# Patient Record
Sex: Female | Born: 1960 | Race: White | Hispanic: No | Marital: Married | Smoking: Current every day smoker
Health system: Southern US, Community
[De-identification: ages and names within clinical notes are randomized; demographics above are authoritative.]

## PROBLEM LIST (undated history)

## (undated) DIAGNOSIS — L409 Psoriasis, unspecified: Secondary | ICD-10-CM

## (undated) DIAGNOSIS — E559 Vitamin D deficiency, unspecified: Secondary | ICD-10-CM

## (undated) DIAGNOSIS — K219 Gastro-esophageal reflux disease without esophagitis: Secondary | ICD-10-CM

## (undated) DIAGNOSIS — J45909 Unspecified asthma, uncomplicated: Secondary | ICD-10-CM

## (undated) DIAGNOSIS — K900 Celiac disease: Secondary | ICD-10-CM

## (undated) DIAGNOSIS — Z972 Presence of dental prosthetic device (complete) (partial): Secondary | ICD-10-CM

## (undated) DIAGNOSIS — T7840XA Allergy, unspecified, initial encounter: Secondary | ICD-10-CM

## (undated) HISTORY — PX: HERNIA REPAIR: SHX51

## (undated) HISTORY — DX: Psoriasis, unspecified: L40.9

## (undated) HISTORY — DX: Allergy, unspecified, initial encounter: T78.40XA

## (undated) HISTORY — DX: Presence of dental prosthetic device (complete) (partial): Z97.2

## (undated) HISTORY — DX: Celiac disease: K90.0

## (undated) HISTORY — DX: Vitamin D deficiency, unspecified: E55.9

## (undated) HISTORY — DX: Gastro-esophageal reflux disease without esophagitis: K21.9

## (undated) HISTORY — DX: Unspecified asthma, uncomplicated: J45.909

---

## 1983-12-24 HISTORY — PX: KNEE ARTHROSCOPY: SUR90

## 1991-12-24 HISTORY — PX: TUBAL LIGATION: SHX77

## 2006-04-07 ENCOUNTER — Other Ambulatory Visit: Admission: RE | Admit: 2006-04-07 | Discharge: 2006-04-07 | Payer: Self-pay | Admitting: Gynecology

## 2006-04-14 ENCOUNTER — Encounter: Admission: RE | Admit: 2006-04-14 | Discharge: 2006-04-14 | Payer: Self-pay | Admitting: Gynecology

## 2006-05-15 ENCOUNTER — Encounter: Admission: RE | Admit: 2006-05-15 | Discharge: 2006-05-15 | Payer: Self-pay | Admitting: Gynecology

## 2006-08-28 ENCOUNTER — Encounter: Admission: RE | Admit: 2006-08-28 | Discharge: 2006-08-28 | Payer: Self-pay | Admitting: Gynecology

## 2007-05-25 ENCOUNTER — Encounter: Admission: RE | Admit: 2007-05-25 | Discharge: 2007-05-25 | Payer: Self-pay | Admitting: Family Medicine

## 2007-12-24 HISTORY — PX: UPPER GI ENDOSCOPY: SHX6162

## 2008-02-05 ENCOUNTER — Encounter: Payer: Self-pay | Admitting: Gastroenterology

## 2008-02-11 ENCOUNTER — Encounter: Admission: RE | Admit: 2008-02-11 | Discharge: 2008-02-11 | Payer: Self-pay | Admitting: Gastroenterology

## 2008-02-11 ENCOUNTER — Encounter (INDEPENDENT_AMBULATORY_CARE_PROVIDER_SITE_OTHER): Payer: Self-pay | Admitting: *Deleted

## 2008-12-23 HISTORY — PX: UMBILICAL HERNIA REPAIR: SHX196

## 2008-12-30 ENCOUNTER — Encounter: Admission: RE | Admit: 2008-12-30 | Discharge: 2008-12-30 | Payer: Self-pay | Admitting: Family Medicine

## 2009-01-10 ENCOUNTER — Encounter (INDEPENDENT_AMBULATORY_CARE_PROVIDER_SITE_OTHER): Payer: Self-pay | Admitting: *Deleted

## 2009-01-10 ENCOUNTER — Encounter: Admission: RE | Admit: 2009-01-10 | Discharge: 2009-01-10 | Payer: Self-pay | Admitting: Surgery

## 2009-12-25 ENCOUNTER — Encounter: Admission: RE | Admit: 2009-12-25 | Discharge: 2009-12-25 | Payer: Self-pay | Admitting: Family Medicine

## 2010-09-17 ENCOUNTER — Encounter (INDEPENDENT_AMBULATORY_CARE_PROVIDER_SITE_OTHER): Payer: Self-pay | Admitting: *Deleted

## 2010-10-30 ENCOUNTER — Ambulatory Visit: Payer: Self-pay | Admitting: Gastroenterology

## 2010-10-30 DIAGNOSIS — R109 Unspecified abdominal pain: Secondary | ICD-10-CM | POA: Insufficient documentation

## 2010-10-30 DIAGNOSIS — K900 Celiac disease: Secondary | ICD-10-CM | POA: Insufficient documentation

## 2010-11-26 ENCOUNTER — Encounter: Payer: Self-pay | Admitting: Gastroenterology

## 2010-11-27 ENCOUNTER — Encounter
Admission: RE | Admit: 2010-11-27 | Discharge: 2011-01-22 | Payer: Self-pay | Source: Home / Self Care | Attending: Gastroenterology | Admitting: Gastroenterology

## 2010-12-04 ENCOUNTER — Encounter: Payer: Self-pay | Admitting: Gastroenterology

## 2010-12-11 ENCOUNTER — Encounter
Admission: RE | Admit: 2010-12-11 | Discharge: 2010-12-11 | Payer: Self-pay | Source: Home / Self Care | Attending: Surgery | Admitting: Surgery

## 2010-12-23 HISTORY — PX: PELVIC LAPAROSCOPY: SHX162

## 2010-12-27 ENCOUNTER — Encounter: Payer: Self-pay | Admitting: Gastroenterology

## 2011-01-03 ENCOUNTER — Encounter: Payer: Self-pay | Admitting: Gastroenterology

## 2011-01-07 LAB — CBC
HCT: 38.3 % (ref 36.0–46.0)
Hemoglobin: 13 g/dL (ref 12.0–15.0)
MCH: 31.9 pg (ref 26.0–34.0)
MCHC: 33.9 g/dL (ref 30.0–36.0)
MCV: 93.9 fL (ref 78.0–100.0)
Platelets: 220 10*3/uL (ref 150–400)
RBC: 4.08 MIL/uL (ref 3.87–5.11)
RDW: 11.5 % (ref 11.5–15.5)
WBC: 6.2 10*3/uL (ref 4.0–10.5)

## 2011-01-07 LAB — SURGICAL PCR SCREEN
MRSA, PCR: NEGATIVE
Staphylococcus aureus: NEGATIVE

## 2011-01-08 ENCOUNTER — Ambulatory Visit (HOSPITAL_COMMUNITY)
Admission: RE | Admit: 2011-01-08 | Discharge: 2011-01-08 | Payer: Self-pay | Source: Home / Self Care | Attending: Surgery | Admitting: Surgery

## 2011-01-22 NOTE — Letter (Signed)
Summary: New Patient letter  [facility]  [address] [place], [place] [address]   Phone: [phone]  Fax: [phone]       09/17/2010 MRN: [id number]  Victoria Mann [address] [place], [place]  [address]  Dear Victoria Mann,  Welcome to the Gastroenterology Division at [facility].    You are scheduled to see Dr.  Ardis Hughs on 10-30-10 at 11:00a.m. on the 3rd floor at [facility], [place] [employer].  We ask that you try to arrive at our office 15 minutes prior to your appointment time to allow for check-in.  We would like you to complete the enclosed self-administered evaluation form prior to your visit and bring it with you on the day of your appointment.  We will review it with you.  Also, please bring a complete list of all your medications or, if you prefer, bring the medication bottles and we will list them.  Please bring your insurance card so that we may make a copy of it.  If your insurance requires a referral to see a specialist, please bring your referral form from your primary care physician.  Co-payments are due at the time of your visit and may be paid by cash, check or credit card.     Your office visit will consist of a consult with your physician (includes a physical exam), any laboratory testing he/she may order, scheduling of any necessary diagnostic testing (e.g. x-ray, ultrasound, CT-scan), and scheduling of a procedure (e.g. Endoscopy, Colonoscopy) if required.  Please allow enough time on your schedule to allow for any/all of these possibilities.    If you cannot keep your appointment, please call [phone] to cancel or reschedule prior to your appointment date.  This allows [place] the opportunity to schedule an appointment for another patient in need of care.  If you do not cancel or reschedule by 5 p.m. the business day prior to your appointment date, you will be charged a $50.00 late cancellation/no-show fee.    Thank you for choosing  [facility] for your medical needs.  We appreciate the opportunity to care for you.  Please visit [place] at our website  to learn more about our practice.                     Sincerely,                                                             The Gastroenterology Division

## 2011-01-22 NOTE — Procedures (Signed)
Summary: EGD/[facility]  EGD/[facility] By: Phillis Knack 11/14/2010 13:56:38  _____________________________________________________________________  External Attachment:    Type:   Image     Comment:   External Document

## 2011-01-22 NOTE — Assessment & Plan Note (Signed)
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 for ab out 2 years she has intermittent RUQ full feeling.  Acutally more chronic.   Had umbilical hernia repair about a year ago.  Dr. Brantley Stage. This was done for pain, constant burning near umbilicus.  NOw has a stabbing pain at umbilicus intermittently.  she saw Dr. Collene Mares, underwent endoscopy ?esophagitis found, no biopsies done 2009 or so (results not available). She was told to take nexium, caused severe hives.  She had blistering rash from prilosec.  she does get pyrosis about 2-3 times a week and takes [place].  Takes NSAIDs about 2-3 times a month.  She has gained about 10 pounds in the past year.  No overt GI bleeding.    lab tests done about 6 weeks ago show a very elevated TTG level at 77. Complete metabolic profile and CBC were otherwise normal.  she had an abdominal ultrasound as well as a HIDA scan about 2 years ago and these were normal.             Current Medications (verified): 1)  Advil 200 Mg Tabs (Ibuprofen) .... As Needed 2)  Ibuprofen 200 Mg Tabs (Ibuprofen) .... As Needed  Allergies (verified): 1)  ! Prilosec 2)  ! * Nexium 3)  ! 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  Review of Systems       Pertinent positive and negative review of systems were noted in the above HPI and GI specific review of systems.  All other review of systems was otherwise negative.   Vital Signs:  Patient profile:   50 year old female Height:      64 inches Weight:      134.13 pounds BMI:     23.11 Pulse rate:   80 / minute Pulse rhythm:   regular BP sitting:   122 / 60  (left arm) Cuff size:   regular  Vitals Entered By: June McMurray [place] Deborra Medina) (October 30, 2010 10:43 AM)  Physical Exam  Additional Exam:  Constitutional: generally well appearing Psychiatric: alert and oriented times 3 Eyes: extraocular movements intact Mouth: oropharynx moist, no lesions Neck: supple, no lymphadenopathy Cardiovascular: heart regular rate and rythm Lungs: CTA bilaterally Abdomen: soft, non-tender, non-distended, no obvious ascites, no peritoneal signs, normal bowel sounds Extremities: no lower extremity edema bilaterally Skin: no lesions on visible extremities    Impression & Recommendations:  Problem # 1:  right upper quadrant discomfort,. Umbilical stabbing pain she has had a normal HIDA scan, normal right upper quadrant ultrasound. Perhaps her nagging right upper quadrant discomforts are related to celiac sprue.  Perhaps it is acid phenomenon or even a lactose intolerance phenomenon. She has had esophagitis noted on EGD however she is very allergic to proton  pump inhibitors. The first recommended she try taking Pepcid twice daily for 3-4 weeks, then a lactose free trial, then a completely gluten-free trial to see if her dyspeptic symptoms are improved.  Be standing periumbilical pain may be related to her umbilical hernia, hernia repair 2010. I will send her back to her surgeon for further evaluation.  Patient Instructions: 1)  One of your biggest health concerns is your smoking.  You should try your  absolute best to stop.  If you need assistence, please contact your PCP or Smoking Cessation Class at [facility] [phone]) or [place] (1-800-QUIT-NOW). 2)  We will get records from Dr. Lorie Apley office from 2009 EGD. 3)  We will help with appt to see Dr. Brantley Stage again about umbilical hernia site, pains. 4)  Take one pepcid twice daily with food for 4 weeks.  If no improvement in symptoms, then stop dairy for 1 week.  If no improvement then gluten FREE completely for 2-3 months.  Call Dr. Ardis Hughs' to report on the way. 5)  A copy of this information will be sent to Colbert Ewing. 6)  The medication list was reviewed and reconciled.  All changed / newly prescribed medications were explained.  A complete medication list was provided to the patient / caregiver.  Appended Document: Orders Update/[facility] NUTRITION    Clinical Lists Changes  Problems: Added new problem of ABDOMINAL PAIN OTHER SPECIFIED SITE (ICD-789.09) Orders: Added new Test order of [facility]) - Signed Added new Test order of [facility] ([facility]) - Signed      Appended Document:  received records: EGD 01/2008 Dr. Collene Mares: normal EGD, no biopsies taken

## 2011-01-24 NOTE — Miscellaneous (Signed)
Summary: [facility] [place] [facility] [place] By: Rise Patience 12/05/2010 16:37:13  _____________________________________________________________________  External Attachment:    Type:   Image     Comment:   External Document

## 2011-01-24 NOTE — Letter (Signed)
Summary: [facility]   Imported By: Bubba Hales 12/18/2010 11:19:30  _____________________________________________________________________  External Attachment:    Type:   Image     Comment:   External Document

## 2011-01-28 ENCOUNTER — Encounter: Payer: Self-pay | Admitting: Gastroenterology

## 2011-01-29 ENCOUNTER — Ambulatory Visit: Payer: Self-pay | Admitting: *Deleted

## 2011-01-29 ENCOUNTER — Encounter: Admit: 2011-01-29 | Payer: Self-pay | Admitting: Family Medicine

## 2011-01-30 NOTE — Letter (Signed)
Summary: [facility]   Imported By: Phillis Knack 01/23/2011 10:44:05  _____________________________________________________________________  External Attachment:    Type:   Image     Comment:   External Document

## 2011-01-30 NOTE — Letter (Signed)
Summary: [facility]   Imported By: Phillis Knack 01/23/2011 10:42:38  _____________________________________________________________________  External Attachment:    Type:   Image     Comment:   External Document

## 2011-02-19 NOTE — Letter (Signed)
Summary: [facility]   Imported By: Rise Patience 02/12/2011 14:09:13  _____________________________________________________________________  External Attachment:    Type:   Image     Comment:   External Document

## 2011-11-18 DIAGNOSIS — K900 Celiac disease: Secondary | ICD-10-CM | POA: Insufficient documentation

## 2011-11-18 DIAGNOSIS — L409 Psoriasis, unspecified: Secondary | ICD-10-CM | POA: Insufficient documentation

## 2011-11-20 ENCOUNTER — Ambulatory Visit (INDEPENDENT_AMBULATORY_CARE_PROVIDER_SITE_OTHER): Payer: PRIVATE HEALTH INSURANCE | Admitting: Women's Health

## 2011-11-20 ENCOUNTER — Other Ambulatory Visit (HOSPITAL_COMMUNITY)
Admission: RE | Admit: 2011-11-20 | Discharge: 2011-11-20 | Disposition: A | Discharge status: Home / Self Care | Payer: PRIVATE HEALTH INSURANCE | Source: Ambulatory Visit | Attending: Obstetrics and Gynecology | Admitting: Obstetrics and Gynecology

## 2011-11-20 ENCOUNTER — Encounter: Payer: Self-pay | Admitting: Women's Health

## 2011-11-20 VITALS — BP 110/70 | Ht 64.0 in | Wt 135.0 lb

## 2011-11-20 DIAGNOSIS — Z01419 Encounter for gynecological examination (general) (routine) without abnormal findings: Secondary | ICD-10-CM

## 2011-11-20 DIAGNOSIS — N938 Other specified abnormal uterine and vaginal bleeding: Secondary | ICD-10-CM

## 2011-11-20 DIAGNOSIS — Z13220 Encounter for screening for lipoid disorders: Secondary | ICD-10-CM

## 2011-11-20 DIAGNOSIS — N949 Unspecified condition associated with female genital organs and menstrual cycle: Secondary | ICD-10-CM

## 2011-11-20 NOTE — Progress Notes (Signed)
Victoria Mann 03-26-1961 [id number]    History:    The patient presents for annual exam.  Works for [employer]. Has an 49 year old daughter Victoria Mann who she adopted from a nephew at 48 months.   Past medical history, past surgical history, family history and social history were all reviewed and documented in the EPIC chart.   ROS:  A  ROS was performed and pertinent positives and negatives are included in the history.  Exam:  Filed Vitals:   11/20/11 1025  BP: 110/70    General appearance:  Normal Head/Neck:  Normal, without cervical or supraclavicular adenopathy. Thyroid:  Symmetrical, normal in size, without palpable masses or nodularity. Respiratory  Effort:  Normal  Auscultation:  Clear without wheezing or rhonchi Cardiovascular  Auscultation:  Regular rate, without rubs, murmurs or gallops  Edema/varicosities:  Not grossly evident Abdominal  Soft,nontender, without masses, guarding or rebound.  Liver/spleen:  No organomegaly noted  Hernia:  None appreciated  Skin  Inspection:  Grossly normal  Palpation:  Grossly normal Neurologic/psychiatric  Orientation:  Normal with appropriate conversation.  Mood/affect:  Normal  Genitourinary    Breasts: Examined lying and sitting. Pendulous     Right: Without masses, retractions, discharge or axillary adenopathy.     Left: Without masses, retractions, discharge or axillary adenopathy.   Inguinal/mons:  Normal without inguinal adenopathy  External genitalia:  Normal  BUS/Urethra/Skene's glands:  Normal  Bladder:  Normal  Vagina:  Normal  Cervix:  Normal/numerous nabothian cysts  Uterus:   normal in size, shape and contour.  Midline and mobile  Adnexa/parametria:     Rt: Without masses or tenderness.   Lt: Without masses or tenderness.  Anus and perineum: Normal  Digital rectal exam: Normal sphincter tone without palpated masses or tenderness  Assessment/Plan:  50 y.o. MWF G3 P2 +1 adopted for annual exam.  Monthly  10 day cycle for the last year last cycle 3 weeks. History of  BTL. States has always had long cycles but they have gotten progressively longer. Has had minimal healthcare in the last few years. Normal mammogram in 2007, 2011. Normal pap 2007. Had a normal colonoscopy 10 years ago.   DUB Celiac disease  Plan: Sonohysterogram with Dr. Toney Rakes. CBC, TSH, prolactin,  lipid profile, UA and Pap. SBEs, annual mammogram encouraged, reviewed importance of screening. Continue exercise, calcium rich diet encouraged, vitamin D 1000 daily.  Huel Cote [facility], 12:24 PM 11/20/2011

## 2011-11-25 ENCOUNTER — Ambulatory Visit (INDEPENDENT_AMBULATORY_CARE_PROVIDER_SITE_OTHER): Payer: PRIVATE HEALTH INSURANCE

## 2011-11-25 ENCOUNTER — Other Ambulatory Visit: Payer: Self-pay | Admitting: Gynecology

## 2011-11-25 ENCOUNTER — Ambulatory Visit (INDEPENDENT_AMBULATORY_CARE_PROVIDER_SITE_OTHER): Payer: PRIVATE HEALTH INSURANCE | Admitting: Gynecology

## 2011-11-25 DIAGNOSIS — N938 Other specified abnormal uterine and vaginal bleeding: Secondary | ICD-10-CM

## 2011-11-25 DIAGNOSIS — N946 Dysmenorrhea, unspecified: Secondary | ICD-10-CM

## 2011-11-25 DIAGNOSIS — N920 Excessive and frequent menstruation with regular cycle: Secondary | ICD-10-CM

## 2011-11-25 DIAGNOSIS — N831 Corpus luteum cyst of ovary, unspecified side: Secondary | ICD-10-CM

## 2011-11-25 DIAGNOSIS — N949 Unspecified condition associated with female genital organs and menstrual cycle: Secondary | ICD-10-CM

## 2011-11-25 DIAGNOSIS — N852 Hypertrophy of uterus: Secondary | ICD-10-CM

## 2011-11-25 DIAGNOSIS — D259 Leiomyoma of uterus, unspecified: Secondary | ICD-10-CM

## 2011-11-25 DIAGNOSIS — D251 Intramural leiomyoma of uterus: Secondary | ICD-10-CM

## 2011-11-25 NOTE — Progress Notes (Signed)
50 year old gravida 3 para 2 who was seen in the office November 28 and had brought to our attention that over the course of the past year her cycles been lasting 10 days very heavy the first few days of blood clots and cramping. She denies any intermenstrual bleeding. Patient history of tubal ligation the past. She presented today for sonohysterogram and endometrial biopsy and discussion of treatment options. Patient is a smoker.  Ultrasound: Uterus measured 8.2 x 5.0 x 3.8 cm with an endometrial stripe of 2.7 mm a small intramural myoma measuring 14 x 14 x 11 mm was no noted. Right ovary was normal left ovary had a thick wall cyst internal low level echoes in the periphery measuring 13 x 11 mm. (Corpus luteum cyst). Sonohysterogram no intracavitary defects were noted.  Assessment: Dysmenorrhea menorrhagia. Patient will be a good candidate for outpatient endometrial ablation in our office via the her option technique. We did proceed with doing an endometrial biopsy today in a sterile fashion tissue submitted for histological evaluation. Literature information on endometrial ablation was provided. We'll make arrangements to schedule in the next 2 weeks after the biopsy report comes back. We will place her on Prometrium 200 mg for 2 weeks and plan the ablation at that time. We'll see her the day before for preoperative for placement of laminaria intracervically. All questions were answered and a follow accordingly.

## 2011-11-26 ENCOUNTER — Telehealth: Payer: Self-pay

## 2011-11-26 NOTE — Telephone Encounter (Signed)
Dr. Moshe Salisbury sent me the following staff message: I saw  this patient today in the sonohysterogram and endometrial biopsy. She's had a previous tubal ligation procedure. And has suffered for menorrhagia. I gave her literature formation on the her option endometrial ablation. If you can ASAP check her insurance coverage and then calling her Prometrium 200 mg to take 1 by mouth daily for the next 12 days and we could possibly schedule her for her option on the week of December 17. I would need to see her the day before for placement of laminaria.  I checked her insurance benefits and called her.  Ablation in office applies as Outpatient surgery and she has a deductible and coinsurance instead of a copayment.  It will make her cost $952.00.  Patient said there is no way she will ever be able to afford that.  I told her I would let Dr. Moshe Salisbury know.

## 2011-11-28 ENCOUNTER — Telehealth: Payer: Self-pay

## 2011-11-28 NOTE — Telephone Encounter (Signed)
I called patient to let her know that I spoke with my administrator and we would like to work with her to help her be able to have Her Option Ablation.  We could waive the prepayment and just bill it to her and she could make monthly payments to [facility] for the balance.  She thanked me but said she [place] must wait as she is paying on $1000 bill she [place] has with [facility] from some surgery last year.  I asked her did Dr. Moshe Salisbury give her other options to help her and she said that he did.  She does not want to take any meds to help i.e. birth control pills.  She said she wanted to watch it for the next few months in hopes things would straighten out. I told her to call me if I can help in any way.

## 2013-11-29 ENCOUNTER — Ambulatory Visit (INDEPENDENT_AMBULATORY_CARE_PROVIDER_SITE_OTHER): Payer: 59 | Admitting: Family Medicine

## 2013-11-29 ENCOUNTER — Ambulatory Visit: Payer: 59

## 2013-11-29 VITALS — BP 124/74 | HR 68 | Temp 98.3°F | Resp 16 | Ht 63.25 in | Wt 143.6 lb

## 2013-11-29 DIAGNOSIS — S20212A Contusion of left front wall of thorax, initial encounter: Secondary | ICD-10-CM

## 2013-11-29 DIAGNOSIS — R071 Chest pain on breathing: Secondary | ICD-10-CM

## 2013-11-29 DIAGNOSIS — R0789 Other chest pain: Secondary | ICD-10-CM

## 2013-11-29 DIAGNOSIS — S20219A Contusion of unspecified front wall of thorax, initial encounter: Secondary | ICD-10-CM

## 2013-11-29 MED ORDER — HYDROCODONE-ACETAMINOPHEN 5-325 MG PO TABS: 1.0000 | ORAL_TABLET | Freq: Three times a day (TID) | ORAL | Status: DC | PRN

## 2013-11-29 NOTE — Progress Notes (Signed)
[facility] [address], [place] [address] [phone]  Date:  11/29/2013   Name:  Victoria Mann   DOB:  March 14, 1961   MRN:  [id number]  PCP:  Jenny Reichmann, MD    Chief Complaint: Fall   History of Present Illness:  Victoria Mann is a 52 y.o. very pleasant female patient who presents with the following:  She is here today with an injury that occurred 2 days ago. She was pushing some boxes of chirstmas decor down the hall and the box shot out from under her.  She fell forward onto the floor.  "I really went down hard."   She has been ill with a cold for about one week as well.  She is concerned because she had a cough with this illness and was bringing some material up; now she cannot cough due to pain.   She notes pain when she coughs, sneezes, moves, breathes deeply.  Her left breast and her ribs hurt.   However she does not have any CP at rest.  When sitting Abair she is comfortable No head injury or LOC.  No abdominal pain, she is able to eat ok.  No nausea or vomiting  She has not noted a fever.   She has some tingling in her left hand when she abducts her arm.   Her neck/ trapezius and upper shoulder have been a bit sore   She is generally healthy except for celiac.   LMP was last month. She is peri- menopausal and her menses are spacing out.   S/p BTL.   Patient Active Problem List   Diagnosis Date Noted  . Menorrhagia 11/25/2011  . Psoriasis   . Celiac sprue   . 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 ABDOMINAL PAIN OTHER SPECIFIED SITE 10/30/2010    Past Medical History  Diagnosis Date  . Psoriasis   . Celiac sprue     Past Surgical History  Procedure Laterality Date  . Tubal ligation  1993  . Knee arthroscopy  1985    LEFT  . Umbilical hernia repair  Jan 2010  . Pelvic laparoscopy  Jan 2012    To remove scar tissue    History  Substance Use Topics  . Smoking status: Current Every Day Smoker -- 0.50 packs/day for 35 years    Types:  Cigarettes  . Smokeless tobacco: Never Used  . Alcohol Use: Yes     Comment: occ.    Family History  Problem Relation Age of Onset  . Diabetes Mother   . Hypertension Mother   . Hyperlipidemia Mother   . Cancer Father     LUNG  . Heart disease Maternal Grandmother     Allergies  Allergen Reactions  . Esomeprazole Magnesium   . Lansoprazole   . Omeprazole     REACTION: rash    Medication list has been reviewed and updated.  Current Outpatient Prescriptions on File Prior to Visit  Medication Sig Dispense Refill  . Multiple Vitamin (MULTIVITAMIN) capsule Take 1 capsule by mouth daily.         No current facility-administered medications on file prior to visit.    Review of Systems:  As per HPI- otherwise negative.   Physical Examination: Filed Vitals:   11/29/13 1225  BP: 124/74  Pulse: 68  Temp: 98.3 F (36.8 C)  Resp: 16   Filed Vitals:   11/29/13 1225  Height: 5' 3.25" (1.607 m)  Weight: 143  lb 9.6 oz (65.137 kg)   Body mass index is 25.22 kg/(m^2). Ideal Body Weight: Weight in (lb) to have BMI = 25: 142  GEN: WDWN, NAD, Non-toxic, A & O x 3, looks well but is upset about her fall, tearful with palpation of the painful areas.   HEENT: Atraumatic, Normocephalic. Neck supple. No masses, No LAD.  Bilateral TM wnl, oropharynx normal.  PEERL,EOMI.   Ears and Nose: No external deformity. CV: RRR, No M/G/R. No JVD. No thrill. No extra heart sounds. PULM: CTA B, no wheezes, crackles, rhonchi. No retractions. No resp. distress. No accessory muscle use. ABD: S, NT, ND. No rebound. No HSM. No bruise over abdomen EXTR: No c/c/e NEURO Normal gait.  PSYCH: Normally interactive. Conversant. Not depressed or anxious appearing.  Calm demeanor.  Left shoulder with full ROM.  She is slightly tender over the anterior shoulder.  No bruise.  She has normal strength and sensation of the LUE.  Chest wall: she is quite tender over her left anterior ribs, especially under the  left breast. No bruise.  No wound or lesion  [facility] reading (PRIMARY) by  Dr. Lorelei Pont. Left ribs: negative Chest: negative  CHEST - 1 VIEW  COMPARISON: Chest radiograph and rib study 11/29/2013  FINDINGS: Single lateral view of the chest was obtained. Lungs are clear on the lateral view. Alignment of the thoracic spine is grossly normal.  IMPRESSION: No acute findings.  LEFT RIBS AND CHEST - 3+ VIEW  COMPARISON: Chest 11/29/2013  FINDINGS: Chest radiograph is negative for a pneumothorax. There are subtle densities at the left lung base could represent overlying structures based on the lateral view and no focal lesion on the oblique images in this area. . Otherwise, the lungs are clear. Heart and mediastinum are within normal limits. The trachea is midline. No evidence for a displaced left rib fracture.  IMPRESSION: No evidence for a displaced left rib fracture.  Vague density at the left lung base is probably related to cardiophrenic fat based on the lateral chest view and oblique views. Consider comparison with an old study or 3 month followup to ensure stability.  Assessment and Plan: Rib contusion, left, initial encounter - Plan: DG Ribs Unilateral W/Chest Left, DG Chest 1 View, HYDROcodone-acetaminophen (NORCO/VICODIN) 5-325 MG per tablet  Rib contusion and pain.  At this time I do not see any evidence of a displaced fracture.  vicodin for pain.  Reassured that her lungs are clear without evidence of pneumonia. Encouraged good pulmonary toilet to prevent pneumonia    Signed Lamar Blinks, MD

## 2013-11-29 NOTE — Patient Instructions (Signed)
Use the pain medication as needed for your rib pain.  Try to take some deep breaths a few times a day to keep your lungs open.  If you develop any fever please let me know

## 2014-10-07 ENCOUNTER — Other Ambulatory Visit: Payer: Self-pay

## 2014-10-25 ENCOUNTER — Encounter: Payer: Self-pay | Admitting: Women's Health

## 2014-10-25 ENCOUNTER — Other Ambulatory Visit: Payer: Self-pay

## 2014-10-25 ENCOUNTER — Ambulatory Visit (INDEPENDENT_AMBULATORY_CARE_PROVIDER_SITE_OTHER): Payer: BC Managed Care – PPO | Admitting: Women's Health

## 2014-10-25 ENCOUNTER — Other Ambulatory Visit (HOSPITAL_COMMUNITY)
Admission: RE | Admit: 2014-10-25 | Discharge: 2014-10-25 | Disposition: A | Discharge status: Home / Self Care | Payer: BC Managed Care – PPO | Source: Ambulatory Visit | Attending: Gynecology | Admitting: Gynecology

## 2014-10-25 VITALS — BP 142/78 | Ht 64.0 in | Wt 145.0 lb

## 2014-10-25 DIAGNOSIS — Z01419 Encounter for gynecological examination (general) (routine) without abnormal findings: Secondary | ICD-10-CM

## 2014-10-25 DIAGNOSIS — Z780 Asymptomatic menopausal state: Secondary | ICD-10-CM

## 2014-10-25 DIAGNOSIS — Z13220 Encounter for screening for lipoid disorders: Secondary | ICD-10-CM

## 2014-10-25 DIAGNOSIS — Z1231 Encounter for screening mammogram for malignant neoplasm of breast: Secondary | ICD-10-CM

## 2014-10-25 DIAGNOSIS — N760 Acute vaginitis: Secondary | ICD-10-CM

## 2014-10-25 LAB — CBC WITH DIFFERENTIAL/PLATELET
Basophils Absolute: 0 10*3/uL (ref 0.0–0.1)
Basophils Relative: 0 % (ref 0–1)
Eosinophils Absolute: 0.1 10*3/uL (ref 0.0–0.7)
Eosinophils Relative: 1 % (ref 0–5)
HCT: 41.7 % (ref 36.0–46.0)
Hemoglobin: 14.3 g/dL (ref 12.0–15.0)
Lymphocytes Relative: 32 % (ref 12–46)
Lymphs Abs: 2.4 10*3/uL (ref 0.7–4.0)
MCH: 32.3 pg (ref 26.0–34.0)
MCHC: 34.3 g/dL (ref 30.0–36.0)
MCV: 94.1 fL (ref 78.0–100.0)
Monocytes Absolute: 0.4 10*3/uL (ref 0.1–1.0)
Monocytes Relative: 5 % (ref 3–12)
Neutro Abs: 4.6 10*3/uL (ref 1.7–7.7)
Neutrophils Relative %: 62 % (ref 43–77)
Platelets: 297 10*3/uL (ref 150–400)
RBC: 4.43 MIL/uL (ref 3.87–5.11)
RDW: 13.1 % (ref 11.5–15.5)
WBC: 7.4 10*3/uL (ref 4.0–10.5)

## 2014-10-25 LAB — COMPREHENSIVE METABOLIC PANEL
ALK PHOS: 80 U/L (ref 39–117)
ALT: 13 U/L (ref 0–35)
AST: 18 U/L (ref 0–37)
Albumin: 4.2 g/dL (ref 3.5–5.2)
BILIRUBIN TOTAL: 0.4 mg/dL (ref 0.2–1.2)
BUN: 9 mg/dL (ref 6–23)
CO2: 27 meq/L (ref 19–32)
CREATININE: 0.6 mg/dL (ref 0.50–1.10)
Calcium: 9.1 mg/dL (ref 8.4–10.5)
Chloride: 104 mEq/L (ref 96–112)
GLUCOSE: 82 mg/dL (ref 70–99)
Potassium: 3.8 mEq/L (ref 3.5–5.3)
Sodium: 141 mEq/L (ref 135–145)
Total Protein: 6.3 g/dL (ref 6.0–8.3)

## 2014-10-25 LAB — LIPID PANEL
CHOL/HDL RATIO: 4.3 ratio
CHOLESTEROL: 229 mg/dL — AB (ref 0–200)
HDL: 53 mg/dL (ref >39)
LDL CALC: 133 mg/dL — AB (ref 0–99)
TRIGLYCERIDES: 217 mg/dL — AB (ref <150)
VLDL: 43 mg/dL — AB (ref 0–40)

## 2014-10-25 NOTE — Progress Notes (Signed)
Victoria Mann 14-Jun-1961 [id number]    History:    Presents for annual exam.  BTL. No monthly cycle for one year.  Occasional hot flushes.  Hx of cryotherapy at 53 years old, with normal paps after. 2012 Pap normal. 2011 mammogram with F/U US for painful cysts. Benign. History of Celiac Dz since birth. Well controlled with gluten free diet.  Colonoscopy <10 years ago, no polyps. Smoker, not ready to quit.  Past medical history, past surgical history, family history and social history were all reviewed and documented in the EPIC chart. Works for [employer]. 2 children in 22s doing well, one adopted child 10.  Mother diabetes, HLD, HTN. Father died of lung cancer.   ROS:  A  12 point ROS was performed and pertinent positives and negatives are included.  Exam:  Filed Vitals:   10/25/14 1126  BP: 142/78    General appearance:  Normal Thyroid:  Symmetrical, normal in size, without palpable masses or nodularity. Respiratory  Auscultation:  Clear without wheezing or rhonchi Cardiovascular  Auscultation:  Regular rate, without rubs, murmurs or gallops  Edema/varicosities:  Not grossly evident Abdominal  Soft,nontender, without masses, guarding or rebound.  Liver/spleen:  No organomegaly noted  Hernia:  None appreciated  Skin  Inspection:  Grossly normal   Breasts: Examined lying and sitting.     Right: Without masses, retractions, discharge or axillary adenopathy.     Left: Without masses, retractions, discharge or axillary adenopathy. Gentitourinary   Inguinal/mons:  Normal without inguinal adenopathy  External genitalia:  Normal  BUS/Urethra/Skene's glands:  Normal  Vagina:  Normal  Cervix:  Normal  Uterus:  normal in size, shape and contour.  Midline and mobile  Adnexa/parametria:     Rt: Without masses or tenderness.   Lt: Without masses or tenderness.  Anus and perineum: Normal  Digital rectal exam: Normal sphincter tone without palpated masses or  tenderness  Assessment/Plan:  53 y.o. MWF G3P2 + 1 adopted child  for annual exam with no complaints.     Postmenopausal, No HRT/No bleeding Celiac disease smoker  Plan: CBC, CMP, lipid profile, Vit. D level, UA and Pap with HR HPV typing, new screening guidelines reviewed. SBEs, annual mammogram encouraged, overdue, reviewed importance of annual screen. Continue exercise, calcium rich diet encouraged. DEXA scan, instructed to schedule here. Reviewed importance of no smoking, not ready to quit.     Huel Cote [facility], 12:25 PM 10/25/2014

## 2014-10-25 NOTE — Patient Instructions (Signed)
Health Recommendations for Postmenopausal Women Respected and ongoing research has looked at the most common causes of death, disability, and poor quality of life in postmenopausal women. The causes include heart disease, diseases of blood vessels, diabetes, depression, cancer, and bone loss (osteoporosis). Many things can be done to help lower the chances of developing these and other common problems. CARDIOVASCULAR DISEASE Heart Disease: A heart attack is a medical emergency. Know the signs and symptoms of a heart attack. Below are things women can do to reduce their risk for heart disease.   Do not smoke. If you smoke, quit.  Aim for a healthy weight. Being overweight causes many preventable deaths. Eat a healthy and balanced diet and drink an adequate amount of liquids.  Get moving. Make a commitment to be more physically active. Aim for 30 minutes of activity on most, if not all days of the week.  Eat for heart health. Choose a diet that is low in saturated fat and cholesterol and eliminate trans fat. Include whole grains, vegetables, and fruits. Read and understand the labels on food containers before buying.  Know your numbers. Ask your caregiver to check your blood pressure, cholesterol (total, HDL, LDL, triglycerides) and blood glucose. Work with your caregiver on improving your entire clinical picture.  High blood pressure. Limit or stop your table salt intake (try salt substitute and food seasonings). Avoid salty foods and drinks. Read labels on food containers before buying. Eating well and exercising can help control high blood pressure. STROKE  Stroke is a medical emergency. Stroke may be the result of a blood clot in a blood vessel in the brain or by a brain hemorrhage (bleeding). Know the signs and symptoms of a stroke. To lower the risk of developing a stroke:  Avoid fatty foods.  Quit smoking.  Control your diabetes, blood pressure, and irregular heart rate. THROMBOPHLEBITIS  (BLOOD CLOT) OF THE LEG  Becoming overweight and leading a stationary lifestyle may also contribute to developing blood clots. Controlling your diet and exercising will help lower the risk of developing blood clots. CANCER SCREENING  Breast Cancer: Take steps to reduce your risk of breast cancer.  You should practice "breast self-awareness." This means understanding the normal appearance and feel of your breasts and should include breast self-examination. Any changes detected, no matter how small, should be reported to your caregiver.  After age 43, you should have a clinical breast exam (CBE) every year.  Starting at age 61, you should consider having a mammogram (breast X-ray) every year.  If you have a family history of breast cancer, talk to your caregiver about genetic screening.  If you are at high risk for breast cancer, talk to your caregiver about having an MRI and a mammogram every year.  Intestinal or Stomach Cancer: Tests to consider are a rectal exam, fecal occult blood, sigmoidoscopy, and colonoscopy. Women who are high risk may need to be screened at an earlier age and more often.  Cervical Cancer:  Beginning at age 60, you should have a Pap test every 3 years as long as the past 3 Pap tests have been normal.  If you have had past treatment for cervical cancer or a condition that could lead to cancer, you need Pap tests and screening for cancer for at least 20 years after your treatment.  If you had a hysterectomy for a problem that was not cancer or a condition that could lead to cancer, then you no longer need Pap tests.  If you are between ages 41 and 93, and you have had normal Pap tests going back 10 years, you no longer need Pap tests.  If Pap tests have been discontinued, risk factors (such as a new sexual partner) need to be reassessed to determine if screening should be resumed.  Some medical problems can increase the chance of getting cervical cancer. In these  cases, your caregiver may recommend more frequent screening and Pap tests.  Uterine Cancer: If you have vaginal bleeding after reaching menopause, you should notify your caregiver.  Ovarian Cancer: Other than yearly pelvic exams, there are no reliable tests available to screen for ovarian cancer at this time except for yearly pelvic exams.  Lung Cancer: Yearly chest X-rays can detect lung cancer and should be done on high risk women, such as cigarette smokers and women with chronic lung disease (emphysema).  Skin Cancer: A complete body skin exam should be done at your yearly examination. Avoid overexposure to the sun and ultraviolet light lamps. Use a strong sun block cream when in the sun. All of these things are important for lowering the risk of skin cancer. MENOPAUSE Menopause Symptoms: Hormone therapy products are effective for treating symptoms associated with menopause:  Moderate to severe hot flashes.  Night sweats.  Mood swings.  Headaches.  Tiredness.  Loss of sex drive.  Insomnia.  Other symptoms. Hormone replacement carries certain risks, especially in older women. Women who use or are thinking about using estrogen or estrogen with progestin treatments should discuss that with their caregiver. Your caregiver will help you understand the benefits and risks. The ideal dose of hormone replacement therapy is not known. The Food and Drug Administration (FDA) has concluded that hormone therapy should be used only at the lowest doses and for the shortest amount of time to reach treatment goals.  OSTEOPOROSIS Protecting Against Bone Loss and Preventing Fracture If you use hormone therapy for prevention of bone loss (osteoporosis), the risks for bone loss must outweigh the risk of the therapy. Ask your caregiver about other medications known to be safe and effective for preventing bone loss and fractures. To guard against bone loss or fractures, the following is recommended:  If  you are younger than age 48, take 1000 mg of calcium and at least 600 mg of Vitamin D per day.  If you are older than age 28 but younger than age 52, take 1200 mg of calcium and at least 600 mg of Vitamin D per day.  If you are older than age 47, take 1200 mg of calcium and at least 800 mg of Vitamin D per day. Smoking and excessive alcohol intake increases the risk of osteoporosis. Eat foods rich in calcium and vitamin D and do weight bearing exercises several times a week as your caregiver suggests. DIABETES Diabetes Mellitus: If you have type I or type 2 diabetes, you should keep your blood sugar under control with diet, exercise, and recommended medication. Avoid starchy and fatty foods, and too many sweets. Being overweight can make diabetes control more difficult. COGNITION AND MEMORY Cognition and Memory: Menopausal hormone therapy is not recommended for the prevention of cognitive disorders such as Alzheimer's disease or memory loss.  DEPRESSION  Depression may occur at any age, but it is common in elderly women. This may be because of physical, medical, social (loneliness), or financial problems and needs. If you are experiencing depression because of medical problems and control of symptoms, talk to your caregiver about this. Physical  activity and exercise may help with mood and sleep. Community and volunteer involvement may improve your sense of value and worth. If you have depression and you feel that the problem is getting worse or becoming severe, talk to your caregiver about which treatment options are best for you. ACCIDENTS  Accidents are common and can be serious in elderly woman. Prepare your house to prevent accidents. Eliminate throw rugs, place hand bars in bath, shower, and toilet areas. Avoid wearing high heeled shoes or walking on wet, snowy, and icy areas. Limit or stop driving if you have vision or hearing problems, or if you feel you are unsteady with your movements and  reflexes. HEPATITIS C Hepatitis C is a type of viral infection affecting the liver. It is spread mainly through contact with blood from an infected person. It can be treated, but if left untreated, it can lead to severe liver damage over the years. Many people who are infected do not know that the virus is in their blood. If you are a "baby-boomer", it is recommended that you have one screening test for Hepatitis C. IMMUNIZATIONS  Several immunizations are important to consider having during your senior years, including:   Tetanus, diphtheria, and pertussis booster shot.  Influenza every year before the flu season begins.  Pneumonia vaccine.  Shingles vaccine.  Others, as indicated based on your specific needs. Talk to your caregiver about these. Document Released: 01/31/2006 Document Revised: 04/25/2014 Document Reviewed: 09/26/2008 [facility] Patient Information 2015 [place], [place]. This information is not intended to replace advice given to you by your health care provider. Make sure you discuss any questions you have with your health care provider.

## 2014-10-25 NOTE — Addendum Note (Signed)
Addended by: Burnett Kanaris on: 10/25/2014 03:48 PM   Modules accepted: Orders, SmartSet

## 2014-10-26 ENCOUNTER — Other Ambulatory Visit: Payer: Self-pay | Admitting: *Deleted

## 2014-10-26 DIAGNOSIS — E559 Vitamin D deficiency, unspecified: Secondary | ICD-10-CM

## 2014-10-26 DIAGNOSIS — E782 Mixed hyperlipidemia: Secondary | ICD-10-CM

## 2014-10-26 LAB — VITAMIN D 25 HYDROXY (VIT D DEFICIENCY, FRACTURES): Vit D, 25-Hydroxy: 21 ng/mL — ABNORMAL LOW (ref 30–89)

## 2014-10-26 MED ORDER — VITAMIN D (ERGOCALCIFEROL) 1.25 MG (50000 UNIT) PO CAPS: 50000.0000 [IU] | ORAL_CAPSULE | ORAL | Status: DC

## 2014-10-27 LAB — CYTOLOGY - PAP

## 2014-11-09 ENCOUNTER — Ambulatory Visit
Admission: RE | Admit: 2014-11-09 | Discharge: 2014-11-09 | Disposition: A | Discharge status: Home / Self Care | Payer: BC Managed Care – PPO | Source: Ambulatory Visit

## 2014-11-09 DIAGNOSIS — Z1231 Encounter for screening mammogram for malignant neoplasm of breast: Secondary | ICD-10-CM

## 2014-11-10 ENCOUNTER — Encounter: Payer: Self-pay | Admitting: Women's Health

## 2015-01-02 ENCOUNTER — Encounter: Payer: Self-pay | Admitting: Women's Health

## 2015-01-17 ENCOUNTER — Other Ambulatory Visit: Payer: BLUE CROSS/BLUE SHIELD

## 2015-01-18 ENCOUNTER — Other Ambulatory Visit: Payer: BLUE CROSS/BLUE SHIELD

## 2015-01-18 DIAGNOSIS — Z780 Asymptomatic menopausal state: Secondary | ICD-10-CM

## 2015-01-18 DIAGNOSIS — E782 Mixed hyperlipidemia: Secondary | ICD-10-CM

## 2015-01-18 DIAGNOSIS — E559 Vitamin D deficiency, unspecified: Secondary | ICD-10-CM

## 2015-01-18 LAB — VITAMIN D 25 HYDROXY (VIT D DEFICIENCY, FRACTURES): Vit D, 25-Hydroxy: 27 ng/mL — ABNORMAL LOW (ref 30–100)

## 2015-01-18 LAB — LIPID PANEL
CHOL/HDL RATIO: 3.1 ratio
Cholesterol: 210 mg/dL — ABNORMAL HIGH (ref 0–200)
HDL: 67 mg/dL (ref >39)
LDL CALC: 119 mg/dL — AB (ref 0–99)
TRIGLYCERIDES: 118 mg/dL (ref <150)
VLDL: 24 mg/dL (ref 0–40)

## 2015-01-19 ENCOUNTER — Encounter: Payer: Self-pay | Admitting: Women's Health

## 2015-01-19 MED ORDER — VITAMIN D (ERGOCALCIFEROL) 1.25 MG (50000 UNIT) PO CAPS: 50000.0000 [IU] | ORAL_CAPSULE | ORAL | Status: DC

## 2015-08-18 ENCOUNTER — Ambulatory Visit (INDEPENDENT_AMBULATORY_CARE_PROVIDER_SITE_OTHER): Payer: BLUE CROSS/BLUE SHIELD | Admitting: Family Medicine

## 2015-08-18 VITALS — BP 108/64 | HR 92 | Temp 98.7°F | Resp 16 | Ht 63.75 in | Wt 143.2 lb

## 2015-08-18 DIAGNOSIS — T148 Other injury of unspecified body region: Secondary | ICD-10-CM | POA: Diagnosis not present

## 2015-08-18 DIAGNOSIS — W57XXXA Bitten or stung by nonvenomous insect and other nonvenomous arthropods, initial encounter: Secondary | ICD-10-CM

## 2015-08-18 MED ORDER — CEPHALEXIN 500 MG PO CAPS: 500.0000 mg | ORAL_CAPSULE | Freq: Three times a day (TID) | ORAL | Status: DC

## 2015-08-18 NOTE — Progress Notes (Signed)
[facility] [address], [place] [address] [phone]  Date:  08/18/2015   Name:  Victoria Mann   DOB:  [birth year]   MRN:  [id number]  PCP:  Jenny Reichmann, MD    Chief Complaint: Insect Bite   History of Present Illness:  Victoria Mann is a 54 y.o. very pleasant female patient who presents with the following:  She is here today due to a lesion on the back on her right leg which she noticed about 6 days ago.  It developed after they spend the night in a cabin- it was red, hard and blistered.  It now looks better.  It did drain some pus or fliud over the last few days.  Due to location behind her knee it is hard for her to see it in detail She never had any fever, the area itched but neve that painful, never had any flu like sx.    Patient Active Problem List   Diagnosis Date Noted  . Psoriasis   . Celiac sprue   . 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 ABDOMINAL PAIN OTHER SPECIFIED SITE 10/30/2010    Past Medical History  Diagnosis Date  . Psoriasis   . Celiac sprue     Past Surgical History  Procedure Laterality Date  . Tubal ligation  1993  . Knee arthroscopy  1985    LEFT  . Umbilical hernia repair  Jan 2010  . Pelvic laparoscopy  Jan 2012    To remove scar tissue    Social History  Substance Use Topics  . Smoking status: Current Every Day Smoker -- 0.50 packs/day for 35 years    Types: Cigarettes  . Smokeless tobacco: Never Used  . Alcohol Use: Yes     Comment: occ.    Family History  Problem Relation Age of Onset  . Diabetes Mother   . Hypertension Mother   . Hyperlipidemia Mother   . Cancer Father     LUNG  . Heart disease Maternal Grandmother     Allergies  Allergen Reactions  . Esomeprazole Magnesium   . Lansoprazole   . Omeprazole     REACTION: rash    Medication list has been reviewed and updated.  No current outpatient prescriptions on file prior to visit.   No current facility-administered medications on file  prior to visit.    Review of Systems:  As per HPI- otherwise negative.   Physical Examination: Filed Vitals:   08/18/15 0839  BP: 108/64  Pulse: 92  Temp: 98.7 F (37.1 C)  Resp: 16   Filed Vitals:   08/18/15 0839  Height: 5' 3.75" (1.619 m)  Weight: 143 lb 3.2 oz (64.955 kg)   Body mass index is 24.78 kg/(m^2). Ideal Body Weight: Weight in (lb) to have BMI = 25: 144.2  GEN: WDWN, NAD, Non-toxic, A & O x 3, looks well HEENT: Atraumatic, Normocephalic. Neck supple. No masses, No LAD. Ears and Nose: No external deformity. CV: RRR, No M/G/R. No JVD. No thrill. No extra heart sounds. PULM: CTA B, no wheezes, crackles, rhonchi. No retractions. No resp. distress. No accessory muscle use. EXTR: No c/c/e. There is a healing lesion behind the right knee- is it scabbed, not red, appears to be healing. No tenderness or drainage NEURO Normal gait.  PSYCH: Normally interactive. Conversant. Not depressed or anxious appearing.  Calm demeanor.    Assessment and Plan: Insect bite - Plan: cephALEXin (KEFLEX) 500 MG capsule  At this point she appears to be healing.  At this point would just observe.  Did give her an rx for keflex to hold- she will use if this needed and let me know  Signed Lamar Blinks, MD

## 2015-08-18 NOTE — Patient Instructions (Addendum)
The bite on your leg looks like it is getting better on its own.   For the time being you do not need to do anything special If it starts getting worse again fill and start the keflex (antibiotic) rx and let me know

## 2015-09-26 ENCOUNTER — Encounter: Payer: Self-pay | Admitting: Emergency Medicine

## 2016-08-16 ENCOUNTER — Ambulatory Visit (INDEPENDENT_AMBULATORY_CARE_PROVIDER_SITE_OTHER): Payer: BLUE CROSS/BLUE SHIELD | Admitting: Women's Health

## 2016-08-16 ENCOUNTER — Encounter: Payer: Self-pay | Admitting: Women's Health

## 2016-08-16 ENCOUNTER — Other Ambulatory Visit: Payer: Self-pay | Admitting: Women's Health

## 2016-08-16 ENCOUNTER — Encounter: Payer: Self-pay | Admitting: Gastroenterology

## 2016-08-16 VITALS — BP 128/80 | Ht 63.0 in | Wt 143.0 lb

## 2016-08-16 DIAGNOSIS — Z01419 Encounter for gynecological examination (general) (routine) without abnormal findings: Secondary | ICD-10-CM | POA: Diagnosis not present

## 2016-08-16 DIAGNOSIS — Z13220 Encounter for screening for lipoid disorders: Secondary | ICD-10-CM | POA: Diagnosis not present

## 2016-08-16 DIAGNOSIS — Z1329 Encounter for screening for other suspected endocrine disorder: Secondary | ICD-10-CM | POA: Diagnosis not present

## 2016-08-16 DIAGNOSIS — Z13820 Encounter for screening for osteoporosis: Secondary | ICD-10-CM

## 2016-08-16 DIAGNOSIS — Z1231 Encounter for screening mammogram for malignant neoplasm of breast: Secondary | ICD-10-CM

## 2016-08-16 LAB — CBC WITH DIFFERENTIAL/PLATELET
BASOS ABS: 0 {cells}/uL (ref 0–200)
Basophils Relative: 0 %
Eosinophils Absolute: 0 cells/uL — ABNORMAL LOW (ref 15–500)
Eosinophils Relative: 0 %
HEMATOCRIT: 43.9 % (ref 35.0–45.0)
HEMOGLOBIN: 15 g/dL (ref 11.7–15.5)
Lymphocytes Relative: 28 %
Lymphs Abs: 2660 cells/uL (ref 850–3900)
MCH: 32.5 pg (ref 27.0–33.0)
MCHC: 34.2 g/dL (ref 32.0–36.0)
MCV: 95.2 fL (ref 80.0–100.0)
MPV: 10.8 fL (ref 7.5–12.5)
Monocytes Absolute: 380 cells/uL (ref 200–950)
Monocytes Relative: 4 %
Neutro Abs: 6460 cells/uL (ref 1500–7800)
Neutrophils Relative %: 68 %
Platelets: 292 10*3/uL (ref 140–400)
RBC: 4.61 MIL/uL (ref 3.80–5.10)
RDW: 12.9 % (ref 11.0–15.0)
WBC: 9.5 10*3/uL (ref 3.8–10.8)

## 2016-08-16 LAB — LIPID PANEL
CHOLESTEROL: 238 mg/dL — AB (ref 125–200)
HDL: 69 mg/dL (ref >=46)
LDL Cholesterol: 139 mg/dL — ABNORMAL HIGH (ref <130)
TRIGLYCERIDES: 150 mg/dL — AB (ref <150)
Total CHOL/HDL Ratio: 3.4 Ratio (ref <=5.0)
VLDL: 30 mg/dL (ref <30)

## 2016-08-16 LAB — COMPREHENSIVE METABOLIC PANEL
ALBUMIN: 4.3 g/dL (ref 3.6–5.1)
ALT: 13 U/L (ref 6–29)
AST: 17 U/L (ref 10–35)
Alkaline Phosphatase: 69 U/L (ref 33–130)
BILIRUBIN TOTAL: 0.5 mg/dL (ref 0.2–1.2)
BUN: 11 mg/dL (ref 7–25)
CALCIUM: 9.6 mg/dL (ref 8.6–10.4)
CHLORIDE: 105 mmol/L (ref 98–110)
CO2: 25 mmol/L (ref 20–31)
Creat: 0.69 mg/dL (ref 0.50–1.05)
Glucose, Bld: 87 mg/dL (ref 65–99)
Potassium: 3.8 mmol/L (ref 3.5–5.3)
Sodium: 142 mmol/L (ref 135–146)
TOTAL PROTEIN: 6.5 g/dL (ref 6.1–8.1)

## 2016-08-16 LAB — TSH: TSH: 1.15 m[IU]/L

## 2016-08-16 NOTE — Progress Notes (Signed)
Victoria Mann [birth year] [id number]    History:    Presents for annual exam.  Postmenopausal on no HRT with no bleeding. Had cryo-at age 55 with normal Paps after. Normal mammogram history. Has not had a DEXA reports insurance does not cover. 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 Has not had a screening colonoscopy in years. Smokes  half pack cigarettes daily.  Past medical history, past surgical history, family history and social history were all reviewed and documented in the EPIC chart. Works for [employer], husband Warehouse manager for [employer] at holidays. 2 children in her 54s both doing well, adopted daughter age 25. Mother diabetes and hypertension. Father died from lung cancer.  ROS:  A ROS was performed and pertinent positives and negatives are included.  Exam:  Vitals:   08/16/16 1024  BP: 128/80  Weight: 143 lb (64.9 kg)  Height: 5' 3"  (1.6 m)   Body mass index is 25.33 kg/m.   General appearance:  Normal Thyroid:  Symmetrical, normal in size, without palpable masses or nodularity. Respiratory  Auscultation:  Clear without wheezing or rhonchi Cardiovascular  Auscultation:  Regular rate, without rubs, murmurs or gallops  Edema/varicosities:  Not grossly evident Abdominal  Soft,nontender, without masses, guarding or rebound.  Liver/spleen:  No organomegaly noted  Hernia:  None appreciated  Skin  Inspection:  Grossly normal   Breasts: Examined lying and sitting, pendulous.     Right: Without masses, retractions, discharge or axillary adenopathy.     Left: Without masses, retractions, discharge or axillary adenopathy. Gentitourinary   Inguinal/mons:  Normal without inguinal adenopathy  External genitalia:  Normal  BUS/Urethra/Skene's glands:  Normal  Vagina:  Normal  Cervix:  Normal  Uterus:  normal in size, shape and contour.  Midline and mobile  Adnexa/parametria:     Rt: Without masses or tenderness.   Lt: Without masses or  tenderness.  Anus and perineum: Normal  Digital rectal exam: Normal sphincter tone without palpated masses or tenderness  Assessment/Plan:  55 y.o. M WF G2 P2 +1 adopted  for annual exam with no complaints.  Postmenopausal/BTL/no HRT/no bleeding no Celiac disease Smoker half pack daily  Plan: Instructed to schedule appointment with [facility] for follow-up of celiac and screening colonoscopy. SBE's ,  annual screening mammogram overdue instructed to schedule. Calcium rich diet, vitamin D 1000 daily encouraged states had joint and bone pain with high-dose vitamin D. Encouraged to increase regular exercise and decrease smoking, tips to quit reviewed. DEXA, encouraged to schedule, states unsure if insurance covers. Reviewed importance of weightbearing exercise and smoking cessation for bone and health. CBC, CMP, lipid panel, vitamin D, TSH, UA, Pap normal 2016, new screening guidelines reviewed.  Huel Cote [facility], 11:26 AM 08/16/2016

## 2016-08-16 NOTE — Patient Instructions (Addendum)
Smoking Cessation, Tips for Success If you are ready to quit smoking, congratulations! You have chosen to help yourself be healthier. Cigarettes bring nicotine, tar, carbon monoxide, and other irritants into your body. Your lungs, heart, and blood vessels will be able to work better without these poisons. There are many different ways to quit smoking. Nicotine gum, nicotine patches, a nicotine inhaler, or nicotine nasal spray can help with physical craving. Hypnosis, support groups, and medicines help break the habit of smoking. WHAT THINGS CAN I DO TO MAKE QUITTING EASIER?  Here are some tips to help you quit for good:  Pick a date when you will quit smoking completely. Tell all of your friends and family about your plan to quit on that date.  Do not try to slowly cut down on the number of cigarettes you are smoking. Pick a quit date and quit smoking completely starting on that day.  Throw away all cigarettes.   Clean and remove all ashtrays from your home, work, and car.  On a card, write down your reasons for quitting. Carry the card with you and read it when you get the urge to smoke.  Cleanse your body of nicotine. Drink enough water and fluids to keep your urine clear or pale yellow. Do this after quitting to flush the nicotine from your body.  Learn to predict your moods. Do not let a bad situation be your excuse to have a cigarette. Some situations in your life might tempt you into wanting a cigarette.  Never have "just one" cigarette. It leads to wanting another and another. Remind yourself of your decision to quit.  Change habits associated with smoking. If you smoked while driving or when feeling stressed, try other activities to replace smoking. Stand up when drinking your coffee. Brush your teeth after eating. Sit in a different chair when you read the paper. Avoid alcohol while trying to quit, and try to drink fewer caffeinated beverages. Alcohol and caffeine may urge you to  smoke.  Avoid foods and drinks that can trigger a desire to smoke, such as sugary or spicy foods and alcohol.  Ask people who smoke not to smoke around you.  Have something planned to do right after eating or having a cup of coffee. For example, plan to take a walk or exercise.  Try a relaxation exercise to calm you down and decrease your stress. Remember, you may be tense and nervous for the first 2 weeks after you quit, but this will pass.  Find new activities to keep your hands busy. Play with a pen, coin, or rubber band. Doodle or draw things on paper.  Brush your teeth right after eating. This will help cut down on the craving for the taste of tobacco after meals. You can also try mouthwash.   Use oral substitutes in place of cigarettes. Try using lemon drops, carrots, cinnamon sticks, or chewing gum. Keep them handy so they are available when you have the urge to smoke.  When you have the urge to smoke, try deep breathing.  Designate your home as a nonsmoking area.  If you are a heavy smoker, ask your health care provider about a prescription for nicotine chewing gum. It can ease your withdrawal from nicotine.  Reward yourself. Set aside the cigarette money you save and buy yourself something nice.  Look for support from others. Join a support group or smoking cessation program. Ask someone at home or at work to help you with your plan   to quit smoking.  Always ask yourself, "Do I need this cigarette or is this just a reflex?" Tell yourself, "Today, I choose not to smoke," or "I do not want to smoke." You are reminding yourself of your decision to quit.  Do not replace cigarette smoking with electronic cigarettes (commonly called e-cigarettes). The safety of e-cigarettes is unknown, and some may contain harmful chemicals.  If you relapse, do not give up! Plan ahead and think about what you will do the next time you get the urge to smoke. HOW WILL I FEEL WHEN I QUIT SMOKING? You  may have symptoms of withdrawal because your body is used to nicotine (the addictive substance in cigarettes). You may crave cigarettes, be irritable, feel very hungry, cough often, get headaches, or have difficulty concentrating. The withdrawal symptoms are only temporary. They are strongest when you first quit but will go away within 10-14 days. When withdrawal symptoms occur, stay in control. Think about your reasons for quitting. Remind yourself that these are signs that your body is healing and getting used to being without cigarettes. Remember that withdrawal symptoms are easier to treat than the major diseases that smoking can cause.  Even after the withdrawal is over, expect periodic urges to smoke. However, these cravings are generally short lived and will go away whether you smoke or not. Do not smoke! WHAT RESOURCES ARE AVAILABLE TO HELP ME QUIT SMOKING? Your health care provider can direct you to community resources or hospitals for support, which may include:  Group support.  Education.  Hypnosis.  Therapy.   This information is not intended to replace advice given to you by your health care provider. Make sure you discuss any questions you have with your health care provider.   Document Released: 09/06/2004 Document Revised: 12/30/2014 Document Reviewed: 05/27/2013 Elsevier Interactive Patient Education [employer]. Menopause is a normal process in which your reproductive ability comes to an end. This process happens gradually over a span of months to years, usually between the ages of 48 and 67. Menopause is complete when you have missed 12 consecutive menstrual periods. It is important to talk with your health care provider about some of the most common conditions that affect postmenopausal women, such as heart disease, cancer, and bone loss (osteoporosis). Adopting a healthy lifestyle and getting preventive care can help to promote your health and wellness. Those actions can  also lower your chances of developing some of these common conditions. WHAT SHOULD I KNOW ABOUT MENOPAUSE? During menopause, you may experience a number of symptoms, such as:  Moderate-to-severe hot flashes.  Night sweats.  Decrease in sex drive.  Mood swings.  Headaches.  Tiredness.  Irritability.  Memory problems.  Insomnia. Choosing to treat or not to treat menopausal changes is an individual decision that you make with your health care provider. WHAT SHOULD I KNOW ABOUT HORMONE REPLACEMENT THERAPY AND SUPPLEMENTS? Hormone therapy products are effective for treating symptoms that are associated with menopause, such as hot flashes and night sweats. Hormone replacement carries certain risks, especially as you become older. If you are thinking about using estrogen or estrogen with progestin treatments, discuss the benefits and risks with your health care provider. WHAT SHOULD I KNOW ABOUT HEART DISEASE AND STROKE? Heart disease, heart attack, and stroke become more likely as you age. This may be due, in part, to the hormonal changes that your body experiences during menopause. These can affect how your body processes dietary fats, triglycerides, and cholesterol. Heart attack  and stroke are both medical emergencies. There are many things that you can do to help prevent heart disease and stroke:  Have your blood pressure checked at least every 1-2 years. High blood pressure causes heart disease and increases the risk of stroke.  If you are 65-45 years old, ask your health care provider if you should take aspirin to prevent a heart attack or a stroke.  Do not use any tobacco products, including cigarettes, chewing tobacco, or electronic cigarettes. If you need help quitting, ask your health care provider.  It is important to eat a healthy diet and maintain a healthy weight.  Be sure to include plenty of vegetables, fruits, low-fat dairy products, and lean protein.  Avoid eating  foods that are high in solid fats, added sugars, or salt (sodium).  Get regular exercise. This is one of the most important things that you can do for your health.  Try to exercise for at least 150 minutes each week. The type of exercise that you do should increase your heart rate and make you sweat. This is known as moderate-intensity exercise.  Try to do strengthening exercises at least twice each week. Do these in addition to the moderate-intensity exercise.  Know your numbers.Ask your health care provider to check your cholesterol and your blood glucose. Continue to have your blood tested as directed by your health care provider. WHAT SHOULD I KNOW ABOUT CANCER SCREENING? There are several types of cancer. Take the following steps to reduce your risk and to catch any cancer development as early as possible. Breast Cancer  Practice breast self-awareness.  This means understanding how your breasts normally appear and feel.  It also means doing regular breast self-exams. Let your health care provider know about any changes, no matter how small.  If you are 28 or older, have a clinician do a breast exam (clinical breast exam or CBE) every year. Depending on your age, family history, and medical history, it may be recommended that you also have a yearly breast X-ray (mammogram).  If you have a family history of breast cancer, talk with your health care provider about genetic screening.  If you are at high risk for breast cancer, talk with your health care provider about having an MRI and a mammogram every year.  Breast cancer (BRCA) gene test is recommended for women who have family members with BRCA-related cancers. Results of the assessment will determine the need for genetic counseling and BRCA1 and for BRCA2 testing. BRCA-related cancers include these types:  Breast. This occurs in males or females.  Ovarian.  Tubal. This may also be called fallopian tube cancer.  Cancer of the  abdominal or pelvic lining (peritoneal cancer).  Prostate.  Pancreatic. Cervical, Uterine, and Ovarian Cancer Your health care provider may recommend that you be screened regularly for cancer of the pelvic organs. These include your ovaries, uterus, and vagina. This screening involves a pelvic exam, which includes checking for microscopic changes to the surface of your cervix (Pap test).  For women ages 21-65, health care providers may recommend a pelvic exam and a Pap test every three years. For women ages 43-65, they may recommend the Pap test and pelvic exam, combined with testing for human papilloma virus (HPV), every five years. Some types of HPV increase your risk of cervical cancer. Testing for HPV may also be done on women of any age who have unclear Pap test results.  Other health care providers may not recommend any screening for  nonpregnant women who are considered low risk for pelvic cancer and have no symptoms. Ask your health care provider if a screening pelvic exam is right for you.  If you have had past treatment for cervical cancer or a condition that could lead to cancer, you need Pap tests and screening for cancer for at least 20 years after your treatment. If Pap tests have been discontinued for you, your risk factors (such as having a new sexual partner) need to be reassessed to determine if you should start having screenings again. Some women have medical problems that increase the chance of getting cervical cancer. In these cases, your health care provider may recommend that you have screening and Pap tests more often.  If you have a family history of uterine cancer or ovarian cancer, talk with your health care provider about genetic screening.  If you have vaginal bleeding after reaching menopause, tell your health care provider.  There are currently no reliable tests available to screen for ovarian cancer. Lung Cancer Lung cancer screening is recommended for adults 25-89  years old who are at high risk for lung cancer because of a history of smoking. A yearly low-dose CT scan of the lungs is recommended if you:  Currently smoke.  Have a history of at least 30 pack-years of smoking and you currently smoke or have quit within the past 15 years. A pack-year is smoking an average of one pack of cigarettes per day for one year. Yearly screening should:  Continue until it has been 15 years since you quit.  Stop if you develop a health problem that would prevent you from having lung cancer treatment. Colorectal Cancer  This type of cancer can be detected and can often be prevented.  Routine colorectal cancer screening usually begins at age 4 and continues through age 74.  If you have risk factors for colon cancer, your health care provider may recommend that you be screened at an earlier age.  If you have a family history of colorectal cancer, talk with your health care provider about genetic screening.  Your health care provider may also recommend using home test kits to check for hidden blood in your stool.  A small camera at the end of a tube can be used to examine your colon directly (sigmoidoscopy or colonoscopy). This is done to check for the earliest forms of colorectal cancer.  Direct examination of the colon should be repeated every 5-10 years until age 19. However, if early forms of precancerous polyps or small growths are found or if you have a family history or genetic risk for colorectal cancer, you may need to be screened more often. Skin Cancer  Check your skin from head to toe regularly.  Monitor any moles. Be sure to tell your health care provider:  About any new moles or changes in moles, especially if there is a change in a mole's shape or color.  If you have a mole that is larger than the size of a pencil eraser.  If any of your family members has a history of skin cancer, especially at a Sharief Wainwright age, talk with your health care provider  about genetic screening.  Always use sunscreen. Apply sunscreen liberally and repeatedly throughout the day.  Whenever you are outside, protect yourself by wearing long sleeves, pants, a wide-brimmed hat, and sunglasses. WHAT SHOULD I KNOW ABOUT OSTEOPOROSIS? Osteoporosis is a condition in which bone destruction happens more quickly than new bone creation. After menopause, you may  be at an increased risk for osteoporosis. To help prevent osteoporosis or the bone fractures that can happen because of osteoporosis, the following is recommended:  If you are 10-76 years old, get at least 1,000 mg of calcium and at least 600 mg of vitamin D per day.  If you are older than age 36 but younger than age 45, get at least 1,200 mg of calcium and at least 600 mg of vitamin D per day.  If you are older than age 4, get at least 1,200 mg of calcium and at least 800 mg of vitamin D per day. Smoking and excessive alcohol intake increase the risk of osteoporosis. Eat foods that are rich in calcium and vitamin D, and do weight-bearing exercises several times each week as directed by your health care provider. WHAT SHOULD I KNOW ABOUT HOW MENOPAUSE AFFECTS [place]? Depression may occur at any age, but it is more common as you become older. Common symptoms of depression include:  Low or sad mood.  Changes in sleep patterns.  Changes in appetite or eating patterns.  Feeling an overall lack of motivation or enjoyment of activities that you previously enjoyed.  Frequent crying spells. Talk with your health care provider if you think that you are experiencing depression. WHAT SHOULD I KNOW ABOUT IMMUNIZATIONS? It is important that you get and maintain your immunizations. These include:  Tetanus, diphtheria, and pertussis (Tdap) booster vaccine.  Influenza every year before the flu season begins.  Pneumonia vaccine.  Shingles vaccine. Your health care provider may also recommend other  immunizations.   This information is not intended to replace advice given to you by your health care provider. Make sure you discuss any questions you have with your health care provider.  [place] [phone]   Document Released: 01/31/2006 Document Revised: 12/30/2014 Document Reviewed: 08/11/2014 Elsevier Interactive Patient Education [employer].

## 2016-08-17 LAB — URINALYSIS W MICROSCOPIC + REFLEX CULTURE
BACTERIA UA: NONE SEEN [HPF]
Bilirubin Urine: NEGATIVE
Casts: NONE SEEN [LPF]
Crystals: NONE SEEN [HPF]
Glucose, UA: NEGATIVE
Hgb urine dipstick: NEGATIVE
KETONES UR: NEGATIVE
Nitrite: NEGATIVE
PROTEIN: NEGATIVE
Specific Gravity, Urine: 1.014 (ref 1.001–1.035)
Yeast: NONE SEEN [HPF]
pH: 7 (ref 5.0–8.0)

## 2016-08-17 LAB — VITAMIN D 25 HYDROXY (VIT D DEFICIENCY, FRACTURES): Vit D, 25-Hydroxy: 19 ng/mL — ABNORMAL LOW (ref 30–100)

## 2016-08-18 ENCOUNTER — Encounter: Payer: Self-pay | Admitting: Gynecology

## 2016-08-19 LAB — URINE CULTURE

## 2016-08-21 ENCOUNTER — Other Ambulatory Visit: Payer: Self-pay | Admitting: Gynecology

## 2016-08-21 DIAGNOSIS — E559 Vitamin D deficiency, unspecified: Secondary | ICD-10-CM

## 2016-08-21 MED ORDER — VITAMIN D (ERGOCALCIFEROL) 1.25 MG (50000 UNIT) PO CAPS: 50000.0000 [IU] | ORAL_CAPSULE | ORAL | 0 refills | Status: DC

## 2016-08-23 ENCOUNTER — Encounter: Payer: Self-pay | Admitting: Women's Health

## 2016-08-23 ENCOUNTER — Ambulatory Visit
Admission: RE | Admit: 2016-08-23 | Discharge: 2016-08-23 | Disposition: A | Discharge status: Home / Self Care | Payer: BLUE CROSS/BLUE SHIELD | Source: Ambulatory Visit | Attending: Women's Health | Admitting: Women's Health

## 2016-08-23 DIAGNOSIS — Z1231 Encounter for screening mammogram for malignant neoplasm of breast: Secondary | ICD-10-CM

## 2016-09-09 ENCOUNTER — Encounter: Payer: Self-pay | Admitting: *Deleted

## 2016-10-23 ENCOUNTER — Ambulatory Visit: Payer: BLUE CROSS/BLUE SHIELD | Admitting: *Deleted

## 2016-10-23 VITALS — Ht 63.5 in | Wt 143.6 lb

## 2016-10-23 DIAGNOSIS — Z1211 Encounter for screening for malignant neoplasm of colon: Secondary | ICD-10-CM

## 2016-10-23 MED ORDER — SUPREP BOWEL PREP KIT 17.5-3.13-1.6 GM/177ML PO SOLN: 1.0000 | Freq: Once | ORAL | 0 refills | Status: AC

## 2016-10-23 NOTE — Progress Notes (Signed)
Patient denies any allergies to egg or soy products. Patient denies complications with anesthesia/sedation.  Patient denies oxygen use at home and denies diet medications. Emmi instructions for colonoscopy  explained and given to patient.

## 2016-10-25 ENCOUNTER — Encounter: Payer: Self-pay | Admitting: Gastroenterology

## 2016-11-06 ENCOUNTER — Ambulatory Visit (AMBULATORY_SURGERY_CENTER): Payer: BLUE CROSS/BLUE SHIELD | Admitting: Gastroenterology

## 2016-11-06 ENCOUNTER — Encounter: Payer: Self-pay | Admitting: Gastroenterology

## 2016-11-06 VITALS — BP 109/55 | HR 71 | Temp 98.0°F | Resp 12 | Ht 63.0 in | Wt 143.0 lb

## 2016-11-06 DIAGNOSIS — Z1211 Encounter for screening for malignant neoplasm of colon: Secondary | ICD-10-CM

## 2016-11-06 DIAGNOSIS — Z1212 Encounter for screening for malignant neoplasm of rectum: Secondary | ICD-10-CM

## 2016-11-06 DIAGNOSIS — K5730 Diverticulosis of large intestine without perforation or abscess without bleeding: Secondary | ICD-10-CM | POA: Diagnosis not present

## 2016-11-06 DIAGNOSIS — D122 Benign neoplasm of ascending colon: Secondary | ICD-10-CM | POA: Diagnosis not present

## 2016-11-06 MED ORDER — SODIUM CHLORIDE 0.9 % IV SOLN: 500.0000 mL | INTRAVENOUS | Status: DC

## 2016-11-06 NOTE — Patient Instructions (Signed)
Impression/recommendations:  Polyps (handout given) Diverticulosis (handout given) High Fiber Diet (handout given)  YOU HAD AN ENDOSCOPIC PROCEDURE TODAY AT [place]:   Refer to the procedure report that was given to you for any specific questions about what was found during the examination.  If the procedure report does not answer your questions, please call your gastroenterologist to clarify.  If you requested that your care partner not be given the details of your procedure findings, then the procedure report has been included in a sealed envelope for you to review at your convenience later.  YOU SHOULD EXPECT: Some feelings of bloating in the abdomen. Passage of more gas than usual.  Walking can help get rid of the air that was put into your GI tract during the procedure and reduce the bloating. If you had a lower endoscopy (such as a colonoscopy or flexible sigmoidoscopy) you may notice spotting of blood in your stool or on the toilet paper. If you underwent a bowel prep for your procedure, you may not have a normal bowel movement for a few days.  Please Note:  You might notice some irritation and congestion in your nose or some drainage.  This is from the oxygen used during your procedure.  There is no need for concern and it should clear up in a day or so.  SYMPTOMS TO REPORT IMMEDIATELY:   Following lower endoscopy (colonoscopy or flexible sigmoidoscopy):  Excessive amounts of blood in the stool  Significant tenderness or worsening of abdominal pains  Swelling of the abdomen that is new, acute  Fever of 100F or higher   For urgent or emergent issues, a gastroenterologist can be reached at any hour by calling [phone].   DIET:  We do recommend a small meal at first, but then you may proceed to your regular diet.  Drink plenty of fluids but you should avoid alcoholic beverages for 24 hours.  ACTIVITY:  You should plan to take it easy for the rest of today  and you should NOT DRIVE or use heavy machinery until tomorrow (because of the sedation medicines used during the test).    FOLLOW UP: Our staff will call the number listed on your records the next business day following your procedure to check on you and address any questions or concerns that you may have regarding the information given to you following your procedure. If we do not reach you, we will leave a message.  However, if you are feeling well and you are not experiencing any problems, there is no need to return our call.  We will assume that you have returned to your regular daily activities without incident.  If any biopsies were taken you will be contacted by phone or by letter within the next 1-3 weeks.  Please call us at [phone] if you have not heard about the biopsies in 3 weeks.    SIGNATURES/CONFIDENTIALITY: You and/or your care partner have signed paperwork which will be entered into your electronic medical record.  These signatures attest to the fact that that the information above on your After Visit Summary has been reviewed and is understood.  Full responsibility of the confidentiality of this discharge information lies with you and/or your care-partner.

## 2016-11-06 NOTE — Op Note (Signed)
[facility] Patient Name: Victoria Mann Procedure Date: 11/06/2016 10:33 AM MRN: [id number] Endoscopist: Milus Banister , MD Age: 55 Referring MD:  Date of Birth: 06-30-1961 Gender: Female Account #: [id number] Procedure:                Colonoscopy Indications:              Screening for colorectal malignant neoplasm Medicines:                Monitored Anesthesia Care Procedure:                Pre-Anesthesia Assessment:                           - Prior to the procedure, a History and Physical                            was performed, and patient medications and                            allergies were reviewed. The patient's tolerance of                            previous anesthesia was also reviewed. The risks                            and benefits of the procedure and the sedation                            options and risks were discussed with the patient.                            All questions were answered, and informed consent                            was obtained. Prior Anticoagulants: The patient has                            taken no previous anticoagulant or antiplatelet                            agents. ASA Grade Assessment: II - A patient with                            mild systemic disease. After reviewing the risks                            and benefits, the patient was deemed in                            satisfactory condition to undergo the procedure.                           After obtaining informed consent, the colonoscope  was passed under direct vision. Throughout the                            procedure, the patient's blood pressure, pulse, and                            oxygen saturations were monitored continuously. The                            Model CF-HQ190L [id number]) scope was introduced                            through the anus and advanced to the the cecum,                            identified by  appendiceal orifice and ileocecal                            valve. The colonoscopy was performed without                            difficulty. The patient tolerated the procedure                            well. The quality of the bowel preparation was                            excellent. The ileocecal valve, appendiceal                            orifice, and rectum were photographed. Scope In: 10:36:58 AM Scope Out: 10:48:13 AM Scope Withdrawal Time: 0 hours 8 minutes 47 seconds  Total Procedure Duration: 0 hours 11 minutes 15 seconds  Findings:                 A 2 mm polyp was found in the ascending colon. The                            polyp was sessile. The polyp was removed with a                            cold biopsy forceps. Resection and retrieval were                            complete.                           A 4 mm polyp was found in the ascending colon. The                            polyp was sessile. The polyp was removed with a                            cold snare. Resection and  retrieval were complete.                           Multiple small and large-mouthed diverticula were                            found in the left colon.                           The exam was otherwise without abnormality on                            direct and retroflexion views. Complications:            No immediate complications. Estimated blood loss:                            None. Estimated Blood Loss:     Estimated blood loss: none. Impression:               - One 2 mm polyp in the ascending colon, removed                            with a cold biopsy forceps. Resected and retrieved.                           - One 4 mm polyp in the ascending colon, removed                            with a cold snare. Resected and retrieved.                           - Diverticulosis in the left colon.                           - The examination was otherwise normal on direct                             and retroflexion views. Recommendation:           - Patient has a contact number available for                            emergencies. The signs and symptoms of potential                            delayed complications were discussed with the                            patient. Return to normal activities tomorrow.                            Written discharge instructions were provided to the                            patient.                           -  Resume previous diet.                           - Continue present medications.                           You will receive a letter within 2-3 weeks with the                            pathology results and my final recommendations.                           If the polyp(s) is proven to be 'pre-cancerous' on                            pathology, you will need repeat colonoscopy in 5                            years. If the polyp(s) is NOT 'precancerous' on                            pathology then you should repeat colon cancer                            screening in 10 years with colonoscopy without need                            for colon cancer screening by any method prior to                            then (including stool testing). Milus Banister, MD 11/06/2016 10:50:35 AM This report has been signed electronically.

## 2016-11-06 NOTE — Progress Notes (Signed)
Called to room to assist during endoscopic procedure.  Patient ID and intended procedure confirmed with present staff. Received instructions for my participation in the procedure from the performing physician.  

## 2016-11-07 ENCOUNTER — Telehealth: Payer: Self-pay | Admitting: *Deleted

## 2016-11-07 NOTE — Telephone Encounter (Signed)
  Follow up Call-  Call back number 11/06/2016  Post procedure Call Back phone  # [phone]  Permission to leave phone message Yes  Some recent data might be hidden     Patient questions:  Do you have a fever, pain , or abdominal swelling? No. Pain Score  0 *  Have you tolerated food without any problems? Yes.    Have you been able to return to your normal activities? Yes.    Do you have any questions about your discharge instructions: Diet   No. Medications  No. Follow up visit  No.  Do you have questions or concerns about your Care? No.  Actions: * If pain score is 4 or above: No action needed, pain <4.

## 2016-11-13 ENCOUNTER — Encounter: Payer: Self-pay | Admitting: Gastroenterology

## 2016-11-22 ENCOUNTER — Other Ambulatory Visit: Payer: BLUE CROSS/BLUE SHIELD

## 2016-11-22 DIAGNOSIS — E559 Vitamin D deficiency, unspecified: Secondary | ICD-10-CM

## 2016-11-22 LAB — VITAMIN D 25 HYDROXY (VIT D DEFICIENCY, FRACTURES): Vit D, 25-Hydroxy: 35 ng/mL (ref 30–100)

## 2017-05-01 ENCOUNTER — Ambulatory Visit (INDEPENDENT_AMBULATORY_CARE_PROVIDER_SITE_OTHER): Payer: BLUE CROSS/BLUE SHIELD | Admitting: Emergency Medicine

## 2017-05-01 ENCOUNTER — Encounter: Payer: Self-pay | Admitting: Emergency Medicine

## 2017-05-01 ENCOUNTER — Ambulatory Visit (INDEPENDENT_AMBULATORY_CARE_PROVIDER_SITE_OTHER): Payer: BLUE CROSS/BLUE SHIELD

## 2017-05-01 VITALS — BP 109/69 | HR 74 | Temp 98.2°F | Resp 18 | Ht 63.0 in | Wt 146.6 lb

## 2017-05-01 DIAGNOSIS — R059 Cough, unspecified: Secondary | ICD-10-CM | POA: Insufficient documentation

## 2017-05-01 DIAGNOSIS — R051 Acute cough: Secondary | ICD-10-CM | POA: Insufficient documentation

## 2017-05-01 DIAGNOSIS — J9801 Acute bronchospasm: Secondary | ICD-10-CM | POA: Insufficient documentation

## 2017-05-01 DIAGNOSIS — R05 Cough: Secondary | ICD-10-CM

## 2017-05-01 DIAGNOSIS — J209 Acute bronchitis, unspecified: Secondary | ICD-10-CM | POA: Diagnosis not present

## 2017-05-01 MED ORDER — PROMETHAZINE-CODEINE 6.25-10 MG/5ML PO SYRP: 5.0000 mL | ORAL_SOLUTION | Freq: Every evening | ORAL | 0 refills | Status: DC | PRN

## 2017-05-01 MED ORDER — PREDNISONE 20 MG PO TABS: 40.0000 mg | ORAL_TABLET | Freq: Every day | ORAL | 0 refills | Status: AC

## 2017-05-01 MED ORDER — ALBUTEROL SULFATE HFA 108 (90 BASE) MCG/ACT IN AERS: 2.0000 | INHALATION_SPRAY | Freq: Four times a day (QID) | RESPIRATORY_TRACT | 0 refills | Status: DC | PRN

## 2017-05-01 MED ORDER — AZITHROMYCIN 250 MG PO TABS: ORAL_TABLET | ORAL | 0 refills | Status: DC

## 2017-05-01 NOTE — Patient Instructions (Addendum)
    Acute Bronchitis, Adult Acute bronchitis is when air tubes (bronchi) in the lungs suddenly get swollen. The condition can make it hard to breathe. It can also cause these symptoms:  A cough.  Coughing up clear, yellow, or green mucus.  Wheezing.  Chest congestion.  Shortness of breath.  A fever.  Body aches.  Chills.  A sore throat. Follow these instructions at home: Medicines   Take over-the-counter and prescription medicines only as told by your doctor.  If you were prescribed an antibiotic medicine, take it as told by your doctor. Do not stop taking the antibiotic even if you start to feel better. General instructions   Rest.  Drink enough fluids to keep your pee (urine) clear or pale yellow.  Avoid smoking and secondhand smoke. If you smoke and you need help quitting, ask your doctor. Quitting will help your lungs heal faster.  Use an inhaler, cool mist vaporizer, or humidifier as told by your doctor.  Keep all follow-up visits as told by your doctor. This is important. How is this prevented? To lower your risk of getting this condition again:  Wash your hands often with soap and water. If you cannot use soap and water, use hand sanitizer.  Avoid contact with people who have cold symptoms.  Try not to touch your hands to your mouth, nose, or eyes.  Make sure to get the flu shot every year. Contact a doctor if:  Your symptoms do not get better in 2 weeks. Get help right away if:  You cough up blood.  You have chest pain.  You have very bad shortness of breath.  You become dehydrated.  You faint (pass out) or keep feeling like you are going to pass out.  You keep throwing up (vomiting).  You have a very bad headache.  Your fever or chills gets worse. This information is not intended to replace advice given to you by your health care provider. Make sure you discuss any questions you have with your health care provider. Document Released:  05/27/2008 Document Revised: 07/17/2016 Document Reviewed: 05/29/2016 Elsevier Interactive Patient Education  2017 [employer].   IF you received an x-ray today, you will receive an invoice from [facility]. Please contact [facility] at [phone] with questions or concerns regarding your invoice.   IF you received labwork today, you will receive an invoice from [place]. Please contact [facility] at [phone] with questions or concerns regarding your invoice.   Our billing staff will not be able to assist you with questions regarding bills from these companies.  You will be contacted with the lab results as soon as they are available. The fastest way to get your results is to activate your My Chart account. Instructions are located on the last page of this paperwork. If you have not heard from [place] regarding the results in 2 weeks, please contact this office.

## 2017-05-01 NOTE — Progress Notes (Signed)
Victoria Mann 56 y.o.   Chief Complaint  Patient presents with  . Cough    X 1 week    HISTORY OF PRESENT ILLNESS: This is a 56 y.o. female complaining of 1 week h/o cough.  Cough  This is a new problem. The current episode started in the past 7 days. The problem has been gradually worsening. The problem occurs constantly. The cough is productive of sputum. Associated symptoms include nasal congestion, postnasal drip and wheezing. Pertinent negatives include no chest pain, chills, ear pain, eye redness, fever, headaches, heartburn, hemoptysis, myalgias, rash, rhinorrhea, sore throat, shortness of breath, sweats or weight loss. Risk factors for lung disease include smoking/tobacco exposure. She has tried nothing for the symptoms. Her past medical history is significant for COPD (early).     Prior to Admission medications   Medication Sig Start Date End Date Taking? Authorizing Provider  diphenhydrAMINE (BENADRYL) 25 MG tablet Take 25 mg by mouth every 6 (six) hours as needed for allergies.   Yes [provider]  ibuprofen (ADVIL,MOTRIN) 400 MG tablet Take 400 mg by mouth every 6 (six) hours as needed for fever, headache, mild pain or cramping.   Yes [provider]  Multiple Vitamin (MULTIVITAMIN) tablet Take 1 tablet by mouth daily.   Yes [provider]  Omega-3 Fatty Acids (FISH OIL) 1000 MG CAPS Take 2,000 mg by mouth daily.   Yes [provider]  Vitamin D, Ergocalciferol, (DRISDOL) 50000 units CAPS capsule Take 1 capsule (50,000 Units total) by mouth every 7 (seven) days. Recheck Vit D level at end of Rx. Patient not taking: Reported on 05/01/2017 08/21/16   Terrance Mass, MD    Allergies  Allergen Reactions  . Esomeprazole Magnesium Hives  . Lansoprazole Hives  . Omeprazole Hives    REACTION: rash    Patient Active Problem List   Diagnosis Date Noted  . Psoriasis   . Celiac sprue   . 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 ABDOMINAL PAIN OTHER  SPECIFIED SITE 10/30/2010    Past Medical History:  Diagnosis Date  . Allergy   . Celiac sprue   . Psoriasis   . Vitamin D deficiency   . Wears dentures    upper only    Past Surgical History:  Procedure Laterality Date  . KNEE ARTHROSCOPY  1985   LEFT  . PELVIC LAPAROSCOPY  Jan 2012   To remove scar tissue  . TUBAL LIGATION  1993  . UMBILICAL HERNIA REPAIR  Jan 2010  . UPPER GI ENDOSCOPY  2009   f/u celiac     Social History   Social History  . Marital status: Married    Spouse name: N/A  . Number of children: N/A  . Years of education: N/A   Occupational History  . Not on file.   Social History Main Topics  . Smoking status: Current Every Day Smoker    Packs/day: 0.50    Years: 38.00    Types: Cigarettes  . Smokeless tobacco: Never Used  . Alcohol use 1.2 oz/week    2 Shots of liquor per week  . Drug use: No  . Sexual activity: Yes    Partners: Male    Birth control/ protection: Surgical, Post-menopausal     Comment: BTL   Other Topics Concern  . Not on file   Social History Narrative  . No narrative on file    Family History  Problem Relation Age of Onset  . Diabetes Mother   .  Hypertension Mother   . Hyperlipidemia Mother   . Cancer Father        LUNG  . Colon polyps Father   . Heart disease Maternal Grandmother   . Colon cancer Neg Hx   . Esophageal cancer Neg Hx   . Stomach cancer Neg Hx   . Rectal cancer Neg Hx      Review of Systems  Constitutional: Negative.  Negative for chills, fever and weight loss.  HENT: Positive for congestion and postnasal drip. Negative for ear pain, nosebleeds, rhinorrhea, sinus pain and sore throat.   Eyes: Negative for discharge and redness.  Respiratory: Positive for cough, sputum production and wheezing. Negative for hemoptysis and shortness of breath.   Cardiovascular: Negative for chest pain, palpitations and leg swelling.  Gastrointestinal: Negative for abdominal pain, diarrhea, heartburn, nausea  and vomiting.  Genitourinary: Negative.   Musculoskeletal: Negative for myalgias and neck pain.  Skin: Negative.  Negative for rash.  Neurological: Negative for dizziness and headaches.  Endo/Heme/Allergies: Negative.   All other systems reviewed and are negative.  Vitals:   05/01/17 1618  BP: 109/69  Pulse: 74  Resp: 18  Temp: 98.2 F (36.8 C)   CXR: reviewed; no pneumonia  Physical Exam  Constitutional: She is oriented to person, place, and time. She appears well-developed and well-nourished.  HENT:  Head: Normocephalic and atraumatic.  Nose: Nose normal.  Mouth/Throat: Oropharynx is clear and moist. No oropharyngeal exudate.  Eyes: Conjunctivae and EOM are normal. Pupils are equal, round, and reactive to light.  Neck: Normal range of motion. Neck supple. No JVD present. No thyromegaly present.  Cardiovascular: Normal rate, regular rhythm and normal heart sounds.   Pulmonary/Chest: Effort normal. She has wheezes (mild & intermittent).  Abdominal: Soft. Bowel sounds are normal. She exhibits no distension. There is no tenderness.  Musculoskeletal: Normal range of motion.  Lymphadenopathy:    She has no cervical adenopathy.  Neurological: She is alert and oriented to person, place, and time. No sensory deficit. She exhibits normal muscle tone.  Skin: Skin is warm and dry. Capillary refill takes less than 2 seconds. No rash noted.  Psychiatric: She has a normal mood and affect. Her behavior is normal.  Vitals reviewed.    ASSESSMENT & PLAN: Victoria Mann was seen today for cough.  Diagnoses and all orders for this visit:  Acute bronchitis, unspecified organism  Cough -     DG Chest 2 View; Future  Bronchospasm  Other orders -     azithromycin (ZITHROMAX) 250 MG tablet; Sig as indicated -     predniSONE (DELTASONE) 20 MG tablet; Take 2 tablets (40 mg total) by mouth daily with breakfast. -     promethazine-codeine (PHENERGAN WITH CODEINE) 6.25-10 MG/5ML syrup; Take 5 mLs  by mouth at bedtime as needed for cough. -     albuterol (PROVENTIL HFA;VENTOLIN HFA) 108 (90 Base) MCG/ACT inhaler; Inhale 2 puffs into the lungs every 6 (six) hours as needed for wheezing or shortness of breath.    Patient Instructions      Acute Bronchitis, Adult Acute bronchitis is when air tubes (bronchi) in the lungs suddenly get swollen. The condition can make it hard to breathe. It can also cause these symptoms:  A cough.  Coughing up clear, yellow, or green mucus.  Wheezing.  Chest congestion.  Shortness of breath.  A fever.  Body aches.  Chills.  A sore throat. Follow these instructions at home: Medicines   Take over-the-counter and prescription medicines  only as told by your doctor.  If you were prescribed an antibiotic medicine, take it as told by your doctor. Do not stop taking the antibiotic even if you start to feel better. General instructions   Rest.  Drink enough fluids to keep your pee (urine) clear or pale yellow.  Avoid smoking and secondhand smoke. If you smoke and you need help quitting, ask your doctor. Quitting will help your lungs heal faster.  Use an inhaler, cool mist vaporizer, or humidifier as told by your doctor.  Keep all follow-up visits as told by your doctor. This is important. How is this prevented? To lower your risk of getting this condition again:  Wash your hands often with soap and water. If you cannot use soap and water, use hand sanitizer.  Avoid contact with people who have cold symptoms.  Try not to touch your hands to your mouth, nose, or eyes.  Make sure to get the flu shot every year. Contact a doctor if:  Your symptoms do not get better in 2 weeks. Get help right away if:  You cough up blood.  You have chest pain.  You have very bad shortness of breath.  You become dehydrated.  You faint (pass out) or keep feeling like you are going to pass out.  You keep throwing up (vomiting).  You have a very  bad headache.  Your fever or chills gets worse. This information is not intended to replace advice given to you by your health care provider. Make sure you discuss any questions you have with your health care provider. Document Released: 05/27/2008 Document Revised: 07/17/2016 Document Reviewed: 05/29/2016 Elsevier Interactive Patient Education  2017 [employer].   IF you received an x-ray today, you will receive an invoice from [facility]. Please contact [facility] at [phone] with questions or concerns regarding your invoice.   IF you received labwork today, you will receive an invoice from [place]. Please contact [facility] at [phone] with questions or concerns regarding your invoice.   Our billing staff will not be able to assist you with questions regarding bills from these companies.  You will be contacted with the lab results as soon as they are available. The fastest way to get your results is to activate your My Chart account. Instructions are located on the last page of this paperwork. If you have not heard from [place] regarding the results in 2 weeks, please contact this office.          Agustina Caroli, MD [facility]

## 2017-05-07 ENCOUNTER — Encounter: Payer: Self-pay | Admitting: Gynecology

## 2017-06-23 ENCOUNTER — Ambulatory Visit (INDEPENDENT_AMBULATORY_CARE_PROVIDER_SITE_OTHER): Payer: BLUE CROSS/BLUE SHIELD | Admitting: Emergency Medicine

## 2017-06-23 ENCOUNTER — Encounter: Payer: Self-pay | Admitting: Emergency Medicine

## 2017-06-23 ENCOUNTER — Ambulatory Visit (INDEPENDENT_AMBULATORY_CARE_PROVIDER_SITE_OTHER): Payer: BLUE CROSS/BLUE SHIELD

## 2017-06-23 VITALS — BP 114/64 | HR 79 | Temp 98.3°F | Resp 16 | Ht 63.5 in | Wt 146.0 lb

## 2017-06-23 DIAGNOSIS — M79672 Pain in left foot: Secondary | ICD-10-CM

## 2017-06-23 NOTE — Patient Instructions (Addendum)
     IF you received an x-ray today, you will receive an invoice from [facility]. Please contact [facility] at [phone] with questions or concerns regarding your invoice.   IF you received labwork today, you will receive an invoice from [place]. Please contact [facility] at [phone] with questions or concerns regarding your invoice.   Our billing staff will not be able to assist you with questions regarding bills from these companies.  You will be contacted with the lab results as soon as they are available. The fastest way to get your results is to activate your My Chart account. Instructions are located on the last page of this paperwork. If you have not heard from [place] regarding the results in 2 weeks, please contact this office.     Foot Pain Many things can cause foot pain. Some common causes are:  An injury.  A sprain.  Arthritis.  Blisters.  Bunions.  Follow these instructions at home: Pay attention to any changes in your symptoms. Take these actions to help with your discomfort:  If directed, put ice on the affected area: ? Put ice in a plastic bag. ? Place a towel between your skin and the bag. ? Leave the ice on for 15-20 minutes, 3?4 times a day for 2 days.  Take over-the-counter and prescription medicines only as told by your health care provider.  Wear comfortable, supportive shoes that fit you well. Do not wear high heels.  Do not stand or walk for long periods of time.  Do not lift a lot of weight. This can put added pressure on your feet.  Do stretches to relieve foot pain and stiffness as told by your health care provider.  Rub your foot gently.  Keep your feet clean and dry.  Contact a health care provider if:  Your pain does not get better after a few days of self-care.  Your pain gets worse.  You cannot stand on your foot. Get help right away if:  Your foot is numb or tingling.  Your foot or toes are  swollen.  Your foot or toes turn white or blue.  You have warmth and redness along your foot. This information is not intended to replace advice given to you by your health care provider. Make sure you discuss any questions you have with your health care provider. Document Released: 01/05/2016 Document Revised: 05/16/2016 Document Reviewed: 01/04/2015 Elsevier Interactive Patient Education  [employer].

## 2017-06-23 NOTE — Progress Notes (Signed)
Victoria Mann 56 y.o.   Chief Complaint  Patient presents with  . Foot Pain    LEFT  x 1 month    HISTORY OF PRESENT ILLNESS: This is a 56 y.o. female complaining of pain to left foot x 1 month; denies trauma.  HPI   Prior to Admission medications   Medication Sig Start Date End Date Taking? Authorizing Provider  albuterol (PROVENTIL HFA;VENTOLIN HFA) 108 (90 Base) MCG/ACT inhaler Inhale 2 puffs into the lungs every 6 (six) hours as needed for wheezing or shortness of breath. 05/01/17  Yes Isair Inabinet, Ines Bloomer, MD  diphenhydrAMINE (BENADRYL) 25 MG tablet Take 25 mg by mouth every 6 (six) hours as needed for allergies.   Yes [provider]  ibuprofen (ADVIL,MOTRIN) 400 MG tablet Take 400 mg by mouth every 6 (six) hours as needed for fever, headache, mild pain or cramping.   Yes [provider]  Multiple Vitamin (MULTIVITAMIN) tablet Take 1 tablet by mouth daily.   Yes [provider]  Vitamin D, Ergocalciferol, (DRISDOL) 50000 units CAPS capsule Take 1 capsule (50,000 Units total) by mouth every 7 (seven) days. Recheck Vit D level at end of Rx. 08/21/16  Yes Terrance Mass, MD  Omega-3 Fatty Acids (FISH OIL) 1000 MG CAPS Take 2,000 mg by mouth daily.    [provider]  promethazine-codeine (PHENERGAN WITH CODEINE) 6.25-10 MG/5ML syrup Take 5 mLs by mouth at bedtime as needed for cough. Patient not taking: Reported on 06/23/2017 05/01/17   Horald Pollen, MD    Allergies  Allergen Reactions  . Esomeprazole Magnesium Hives  . Lansoprazole Hives  . Omeprazole Hives    REACTION: rash    Patient Active Problem List   Diagnosis Date Noted  . Cough 05/01/2017  . Acute bronchitis 05/01/2017  . Bronchospasm 05/01/2017  . Psoriasis   . Celiac sprue   . 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 ABDOMINAL PAIN OTHER SPECIFIED SITE 10/30/2010    Past Medical History:  Diagnosis Date  . Allergy   . Celiac sprue   . Psoriasis   . Vitamin D deficiency    . Wears dentures    upper only    Past Surgical History:  Procedure Laterality Date  . KNEE ARTHROSCOPY  1985   LEFT  . PELVIC LAPAROSCOPY  Jan 2012   To remove scar tissue  . TUBAL LIGATION  1993  . UMBILICAL HERNIA REPAIR  Jan 2010  . UPPER GI ENDOSCOPY  2009   f/u celiac     Social History   Social History  . Marital status: Married    Spouse name: N/A  . Number of children: N/A  . Years of education: N/A   Occupational History  . Not on file.   Social History Main Topics  . Smoking status: Current Every Day Smoker    Packs/day: 0.50    Years: 38.00    Types: Cigarettes  . Smokeless tobacco: Never Used  . Alcohol use 1.2 oz/week    2 Shots of liquor per week  . Drug use: No  . Sexual activity: Yes    Partners: Male    Birth control/ protection: Surgical, Post-menopausal     Comment: BTL   Other Topics Concern  . Not on file   Social History Narrative  . No narrative on file    Family History  Problem Relation Age of Onset  . Diabetes Mother   . Hypertension Mother   . Hyperlipidemia Mother   .  Cancer Father        LUNG  . Colon polyps Father   . Heart disease Maternal Grandmother   . Colon cancer Neg Hx   . Esophageal cancer Neg Hx   . Stomach cancer Neg Hx   . Rectal cancer Neg Hx      Review of Systems  Constitutional: Negative.  Negative for chills and fever.  Gastrointestinal: Negative for nausea and vomiting.  Musculoskeletal: Negative for joint pain and myalgias.  Skin: Negative.  Negative for rash.  Neurological: Negative for sensory change and focal weakness.  All other systems reviewed and are negative.  Vitals:   06/23/17 1602  BP: 114/64  Pulse: 79  Resp: 16  Temp: 98.3 F (36.8 C)    Physical Exam  Constitutional: She is oriented to person, place, and time. She appears well-developed and well-nourished.  HENT:  Head: Normocephalic.  Eyes: Pupils are equal, round, and reactive to light.  Neck: Normal range of  motion.  Cardiovascular: Normal rate.   Pulmonary/Chest: Effort normal.  Musculoskeletal:  Left foot: no erythema or bruising; NVI with FROM; no achilles tenderness.  Neurological: She is alert and oriented to person, place, and time. No sensory deficit. She exhibits normal muscle tone.  Skin: Skin is warm and dry. Capillary refill takes less than 2 seconds.  Psychiatric: She has a normal mood and affect. Her behavior is normal.  Vitals reviewed.  Dg Foot Complete Left  Result Date: 06/23/2017 CLINICAL DATA:  Left foot pain EXAM: LEFT FOOT - COMPLETE 3+ VIEW COMPARISON:  None. FINDINGS: There is no evidence of fracture or dislocation. There is no evidence of arthropathy or other focal bone abnormality. Soft tissues are unremarkable. IMPRESSION: Negative. Electronically Signed   By: Abelardo Diesel M.D.   On: 06/23/2017 16:35     ASSESSMENT & PLAN: Victoria Mann was seen today for foot pain.  Diagnoses and all orders for this visit:  Left foot pain -     DG Foot Complete Left; Future -     Ambulatory referral to Podiatry    Patient Instructions       IF you received an x-ray today, you will receive an invoice from [facility]. Please contact [facility] at [phone] with questions or concerns regarding your invoice.   IF you received labwork today, you will receive an invoice from [facility]. Please contact [facility] at [phone] with questions or concerns regarding your invoice.   Our billing staff will not be able to assist you with questions regarding bills from these companies.  You will be contacted with the lab results as soon as they are available. The fastest way to get your results is to activate your My Chart account. Instructions are located on the last page of this paperwork. If you have not heard from [place] regarding the results in 2 weeks, please contact this office.     Foot Pain Many things can cause foot pain. Some common causes are:  An  injury.  A sprain.  Arthritis.  Blisters.  Bunions.  Follow these instructions at home: Pay attention to any changes in your symptoms. Take these actions to help with your discomfort:  If directed, put ice on the affected area: ? Put ice in a plastic bag. ? Place a towel between your skin and the bag. ? Leave the ice on for 15-20 minutes, 3?4 times a day for 2 days.  Take over-the-counter and prescription medicines only as told by your health care provider.  Wear  comfortable, supportive shoes that fit you well. Do not wear high heels.  Do not stand or walk for long periods of time.  Do not lift a lot of weight. This can put added pressure on your feet.  Do stretches to relieve foot pain and stiffness as told by your health care provider.  Rub your foot gently.  Keep your feet clean and dry.  Contact a health care provider if:  Your pain does not get better after a few days of self-care.  Your pain gets worse.  You cannot stand on your foot. Get help right away if:  Your foot is numb or tingling.  Your foot or toes are swollen.  Your foot or toes turn white or blue.  You have warmth and redness along your foot. This information is not intended to replace advice given to you by your health care provider. Make sure you discuss any questions you have with your health care provider. Document Released: 01/05/2016 Document Revised: 05/16/2016 Document Reviewed: 01/04/2015 Elsevier Interactive Patient Education  2018 [employer].      Agustina Caroli, MD [facility]

## 2017-12-02 ENCOUNTER — Emergency Department (HOSPITAL_COMMUNITY): Payer: Worker's Compensation

## 2017-12-02 ENCOUNTER — Other Ambulatory Visit: Payer: Self-pay

## 2017-12-02 ENCOUNTER — Emergency Department (HOSPITAL_COMMUNITY)
Admission: EM | Admit: 2017-12-02 | Discharge: 2017-12-02 | Disposition: A | Discharge status: Home / Self Care | Payer: Worker's Compensation | Attending: Emergency Medicine | Admitting: Emergency Medicine

## 2017-12-02 ENCOUNTER — Encounter (HOSPITAL_COMMUNITY): Payer: Self-pay

## 2017-12-02 DIAGNOSIS — F17210 Nicotine dependence, cigarettes, uncomplicated: Secondary | ICD-10-CM | POA: Diagnosis not present

## 2017-12-02 DIAGNOSIS — M25571 Pain in right ankle and joints of right foot: Secondary | ICD-10-CM | POA: Insufficient documentation

## 2017-12-02 DIAGNOSIS — Z79899 Other long term (current) drug therapy: Secondary | ICD-10-CM | POA: Diagnosis not present

## 2017-12-02 NOTE — ED Notes (Signed)
Patient states she does not need crutches

## 2017-12-02 NOTE — ED Notes (Signed)
Pt transported to xray 

## 2017-12-02 NOTE — ED Triage Notes (Signed)
Pt states she slipped on ice, injuring her right ankle. Pt reports hearing the ankle pop. Pt reports pain on the inside of the ankle. Limited swelling noted, pulse noted.

## 2017-12-02 NOTE — Discharge Instructions (Signed)
1. Medications: Alternate 600 mg of ibuprofen and [phone] mg of Tylenol every 3 hours as needed for pain. Do not exceed 4000 mg of Tylenol daily. 2. Treatment: rest, ice, elevate and use brace, drink plenty of fluids, gentle stretching 3. Follow Up: Please followup with orthopedics as directed or your PCP in 1 week if no improvement for discussion of your diagnoses and further evaluation after today's visit; Please return to the ER for worsening symptoms or other concerns

## 2017-12-02 NOTE — ED Provider Notes (Signed)
[place] EMERGENCY DEPARTMENT Provider Note   CSN: [id number] Arrival date & time: 12/02/17  3888     History   Chief Complaint Chief Complaint  Patient presents with  . Fall    HPI Victoria Mann is a 56 y.o. female with history of psoriasis who presents today with chief complaint acute onset, constant right ankle pain.  At around 8:30 AM she stepped out of her vehicle onto some ice and her right ankle inverted.  She denies head injury or loss of consciousness.  She denies numbness, tingling, or weakness.  She notes a "pop" when she ambulates on the ankle.  She is able to bear weight although it is very painful.  Pain is throbbing at rest, becomes sharp during ambulation.  Has not tried anything for her symptoms.  The history is provided by the patient.    Past Medical History:  Diagnosis Date  . Allergy   . Celiac sprue   . Psoriasis   . Vitamin D deficiency   . Wears dentures    upper only    Patient Active Problem List   Diagnosis Date Noted  . Left foot pain 06/23/2017  . Cough 05/01/2017  . Acute bronchitis 05/01/2017  . Bronchospasm 05/01/2017  . Psoriasis   . Celiac sprue   . 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 ABDOMINAL PAIN OTHER SPECIFIED SITE 10/30/2010    Past Surgical History:  Procedure Laterality Date  . KNEE ARTHROSCOPY  1985   LEFT  . PELVIC LAPAROSCOPY  Jan 2012   To remove scar tissue  . TUBAL LIGATION  1993  . UMBILICAL HERNIA REPAIR  Jan 2010  . UPPER GI ENDOSCOPY  2009   f/u celiac     OB History    Gravida Para Term Preterm AB Living   4 2     2 2    SAB TAB Ectopic Multiple Live Births                   Home Medications    Prior to Admission medications   Medication Sig Start Date End Date Taking? Authorizing Provider  albuterol (PROVENTIL HFA;VENTOLIN HFA) 108 (90 Base) MCG/ACT inhaler Inhale 2 puffs into the lungs every 6 (six) hours as needed for wheezing or shortness of breath. 05/01/17   Horald Pollen, MD  diphenhydrAMINE (BENADRYL) 25 MG tablet Take 25 mg by mouth every 6 (six) hours as needed for allergies.    [provider]  ibuprofen (ADVIL,MOTRIN) 400 MG tablet Take 400 mg by mouth every 6 (six) hours as needed for fever, headache, mild pain or cramping.    [provider]  Multiple Vitamin (MULTIVITAMIN) tablet Take 1 tablet by mouth daily.    [provider]  Omega-3 Fatty Acids (FISH OIL) 1000 MG CAPS Take 2,000 mg by mouth daily.    [provider]  promethazine-codeine (PHENERGAN WITH CODEINE) 6.25-10 MG/5ML syrup Take 5 mLs by mouth at bedtime as needed for cough. Patient not taking: Reported on 06/23/2017 05/01/17   Horald Pollen, MD  Vitamin D, Ergocalciferol, (DRISDOL) 50000 units CAPS capsule Take 1 capsule (50,000 Units total) by mouth every 7 (seven) days. Recheck Vit D level at end of Rx. 08/21/16   Terrance Mass, MD    Family History Family History  Problem Relation Age of Onset  . Diabetes Mother   . Hypertension Mother   . Hyperlipidemia Mother   . Cancer Father  LUNG  . Colon polyps Father   . Heart disease Maternal Grandmother   . Colon cancer Neg Hx   . Esophageal cancer Neg Hx   . Stomach cancer Neg Hx   . Rectal cancer Neg Hx     Social History Social History   Tobacco Use  . Smoking status: Current Every Day Smoker    Packs/day: 0.50    Years: 38.00    Pack years: 19.00    Types: Cigarettes  . Smokeless tobacco: Never Used  Substance Use Topics  . Alcohol use: Yes    Alcohol/week: 1.2 oz    Types: 2 Shots of liquor per week  . Drug use: No     Allergies   Esomeprazole magnesium; Lansoprazole; and Omeprazole   Review of Systems Review of Systems  Musculoskeletal: Positive for arthralgias (R ankle).  Neurological: Negative for syncope, weakness, numbness and headaches.     Physical Exam Updated Vital Signs BP (!) 140/55 (BP Location: Right Arm)   Pulse 77   Temp 98.1 F (36.7  C) (Oral)   Resp 16   Ht 5' 4"  (1.626 m)   Wt 65.8 kg (145 lb)   LMP 09/24/2013   SpO2 100%   BMI 24.89 kg/m   Physical Exam  Constitutional: She appears well-developed and well-nourished. No distress.  HENT:  Head: Normocephalic and atraumatic.  Eyes: Conjunctivae and EOM are normal. Pupils are equal, round, and reactive to light. Right eye exhibits no discharge. Left eye exhibits no discharge.  Neck: Normal range of motion. Neck supple. No JVD present. No tracheal deviation present.  Cardiovascular: Normal rate and intact distal pulses.  No murmur heard. 2+ DP/PT pulses bilaterally, no calf tenderness bilaterally or lower extremity edema  Pulmonary/Chest: Effort normal.  Abdominal: She exhibits no distension.  Musculoskeletal: Normal range of motion. She exhibits tenderness. She exhibits no edema.       Right knee: Normal.       Right ankle: She exhibits swelling. She exhibits no ecchymosis, no deformity, no laceration and normal pulse. Achilles tendon normal.       Left ankle: Normal. Achilles tendon normal.  5/5 strength of BLE major muscle groups.  Normal range of motion of the right ankle although it is painful.  Tender to palpation along the medial and lateral malleoli, worse along the medial malleolus.  No crepitus or deformity noted.  Mild swelling noted to the medial ankle.  Normal examination of the Achilles tendon.  No varus or valgus deformity or ligamentous laxity noted.  Good strength with plantarflexion and dorsiflexion and EHL strength. No right proximal fibular tenderness.  Neurological: She is alert. No sensory deficit. She exhibits normal muscle tone.  Fluent speech, no facial droop, sensation intact to soft touch of bilateral lower extremities.  Antalgic gait, but able to Heel Walk and Toe Walk although it is painful  Skin: Skin is warm and dry. No erythema.  Psychiatric: She has a normal mood and affect. Her behavior is normal.  Nursing note and vitals  reviewed.    ED Treatments / Results  Labs (all labs ordered are listed, but only abnormal results are displayed) Labs Reviewed - No data to display  EKG  EKG Interpretation None       Radiology Dg Ankle Complete Right  Result Date: 12/02/2017 CLINICAL DATA:  Injury.  Pain. EXAM: RIGHT ANKLE - COMPLETE 3+ VIEW COMPARISON:  No recent prior. FINDINGS: No acute bony or joint abnormality identified. No evidence of fracture or  dislocation. IMPRESSION: No acute abnormality. Electronically Signed   By: Marcello Moores  Register   On: 12/02/2017 10:32    Procedures Procedures (including critical care time)  Medications Ordered in ED Medications - No data to display   Initial Impression / Assessment and Plan / ED Course  I have reviewed the triage vital signs and the nursing notes.  Pertinent labs & imaging results that were available during my care of the patient were reviewed by me and considered in my medical decision making (see chart for details).    Patient with right ankle pain secondary to injury earlier today.  Afebrile, vital signs are stable.  Neurovascularly intact.  Ambulatory although it is painful.  Radiographs reviewed by me show no evidence of fracture or dislocation. Low suspicion septic joint or gout int he presence of trauma. RICE therapy indicated and discussed with patient will discharge with ASO ankle brace and crutches.  She will follow-up with orthopedics if symptoms persist greater than 1 week.  Discussed indications for return to the ED. Pt verbalized understanding of and agreement with plan and is safe for discharge home at this time. No complaints prior to discharge.   Final Clinical Impressions(s) / ED Diagnoses   Final diagnoses:  Acute right ankle pain    ED Discharge Orders    None       Debroah Baller 12/02/17 1052    Julianne Rice, MD 12/06/17 1122

## 2017-12-02 NOTE — ED Notes (Signed)
Ortho paged to apply ASO brace.

## 2017-12-02 NOTE — Progress Notes (Signed)
Orthopedic Tech Progress Note Patient Details:  Victoria Mann [birth year] [id number]  Ortho Devices Type of Ortho Device: ASO Ortho Device/Splint Interventions: Application   Post Interventions Patient Tolerated: Well Instructions Provided: Care of device   Victoria Mann 12/02/2017, 11:14 AM

## 2017-12-02 NOTE — Progress Notes (Signed)
Orthopedic Tech Progress Note Patient Details:  Victoria Mann [birth year] [id number]  Ortho Devices Type of Ortho Device: Crutches Ortho Device/Splint Interventions: Application   Post Interventions Patient Tolerated: Refused intervention Instructions Provided: Care of device   [place] Pink 12/02/2017, 11:16 AM

## 2018-03-26 DIAGNOSIS — M25512 Pain in left shoulder: Secondary | ICD-10-CM | POA: Diagnosis not present

## 2018-03-26 DIAGNOSIS — M7502 Adhesive capsulitis of left shoulder: Secondary | ICD-10-CM | POA: Diagnosis not present

## 2018-04-01 DIAGNOSIS — M7502 Adhesive capsulitis of left shoulder: Secondary | ICD-10-CM | POA: Diagnosis not present

## 2018-04-07 DIAGNOSIS — M7502 Adhesive capsulitis of left shoulder: Secondary | ICD-10-CM | POA: Diagnosis not present

## 2018-04-14 DIAGNOSIS — M7502 Adhesive capsulitis of left shoulder: Secondary | ICD-10-CM | POA: Diagnosis not present

## 2018-04-16 DIAGNOSIS — M7502 Adhesive capsulitis of left shoulder: Secondary | ICD-10-CM | POA: Diagnosis not present

## 2018-04-21 DIAGNOSIS — M7502 Adhesive capsulitis of left shoulder: Secondary | ICD-10-CM | POA: Diagnosis not present

## 2018-04-23 DIAGNOSIS — M7502 Adhesive capsulitis of left shoulder: Secondary | ICD-10-CM | POA: Diagnosis not present

## 2018-04-24 DIAGNOSIS — M7502 Adhesive capsulitis of left shoulder: Secondary | ICD-10-CM | POA: Diagnosis not present

## 2018-06-16 ENCOUNTER — Encounter: Payer: Self-pay | Admitting: Emergency Medicine

## 2018-06-16 ENCOUNTER — Ambulatory Visit: Payer: BLUE CROSS/BLUE SHIELD | Admitting: Emergency Medicine

## 2018-06-16 VITALS — BP 133/71 | HR 80 | Temp 98.4°F | Resp 17 | Ht 64.0 in | Wt 148.0 lb

## 2018-06-16 DIAGNOSIS — Z23 Encounter for immunization: Secondary | ICD-10-CM | POA: Diagnosis not present

## 2018-06-16 DIAGNOSIS — R1012 Left upper quadrant pain: Secondary | ICD-10-CM

## 2018-06-16 DIAGNOSIS — R1011 Right upper quadrant pain: Secondary | ICD-10-CM | POA: Diagnosis not present

## 2018-06-16 DIAGNOSIS — Z8719 Personal history of other diseases of the digestive system: Secondary | ICD-10-CM | POA: Diagnosis not present

## 2018-06-16 MED ORDER — RANITIDINE HCL 300 MG PO TABS: 300.0000 mg | ORAL_TABLET | Freq: Every day | ORAL | 1 refills | Status: DC

## 2018-06-16 NOTE — Progress Notes (Signed)
Victoria Mann 57 y.o.   Chief Complaint  Patient presents with  . Dysphagia    x 2-3 weeks  . bloating    HISTORY OF PRESENT ILLNESS: This is a 57 y.o. female with a history of celiac sprue complaining of upper abdominal discomfort with bloating and nausea and excessive belching for the past 2 to 3 weeks.  Denies diarrhea or constipation.  Food feels stuck in the stomach.  No dietary changes to account for the symptoms.  Patient is a smoker.  [place] abuser.  At times she feels sour taste in her mouth with occasional sore throat worse in the morning.  Possible GERD.  Tried Tums without relief.  Allergic to all PPIs.  Has not tried any H2 blockers.  No other significant symptoms.  No recent upper endoscopy.  HPI   Prior to Admission medications   Medication Sig Start Date End Date Taking? Authorizing Provider  albuterol (PROVENTIL HFA;VENTOLIN HFA) 108 (90 Base) MCG/ACT inhaler Inhale 2 puffs into the lungs every 6 (six) hours as needed for wheezing or shortness of breath. 05/01/17  Yes Kirin Brandenburger, Ines Bloomer, MD  diphenhydrAMINE (BENADRYL) 25 MG tablet Take 25 mg by mouth every 6 (six) hours as needed for allergies.   Yes [provider]  ibuprofen (ADVIL,MOTRIN) 400 MG tablet Take 400 mg by mouth every 6 (six) hours as needed for fever, headache, mild pain or cramping.   Yes [provider]  Multiple Vitamin (MULTIVITAMIN) tablet Take 1 tablet by mouth daily.   Yes [provider]  Omega-3 Fatty Acids (FISH OIL) 1000 MG CAPS Take 2,000 mg by mouth daily.   Yes [provider]  Vitamin D, Ergocalciferol, (DRISDOL) 50000 units CAPS capsule Take 1 capsule (50,000 Units total) by mouth every 7 (seven) days. Recheck Vit D level at end of Rx. 08/21/16  Yes Terrance Mass, MD    Allergies  Allergen Reactions  . Esomeprazole Magnesium Hives  . Lansoprazole Hives  . Omeprazole Hives    REACTION: rash    Patient Active Problem List   Diagnosis Date  Noted  . Left foot pain 06/23/2017  . Cough 05/01/2017  . Acute bronchitis 05/01/2017  . Bronchospasm 05/01/2017  . Psoriasis   . Celiac sprue   . 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 ABDOMINAL PAIN OTHER SPECIFIED SITE 10/30/2010    Past Medical History:  Diagnosis Date  . Allergy   . Celiac sprue   . Psoriasis   . Vitamin D deficiency   . Wears dentures    upper only    Past Surgical History:  Procedure Laterality Date  . KNEE ARTHROSCOPY  1985   LEFT  . PELVIC LAPAROSCOPY  Jan 2012   To remove scar tissue  . TUBAL LIGATION  1993  . UMBILICAL HERNIA REPAIR  Jan 2010  . UPPER GI ENDOSCOPY  2009   f/u celiac     Social History   Socioeconomic History  . Marital status: Married    Spouse name: Not on file  . Number of children: Not on file  . Years of education: Not on file  . Highest education level: Not on file  Occupational History  . Not on file  Social Needs  . Financial resource strain: Not on file  . Food insecurity:    Worry: Not on file    Inability: Not on file  . Transportation needs:    Medical: Not on file    Non-medical: Not on file  Tobacco  Use  . Smoking status: Current Every Day Smoker    Packs/day: 0.50    Years: 38.00    Pack years: 19.00    Types: Cigarettes  . Smokeless tobacco: Never Used  Substance and Sexual Activity  . Alcohol use: Yes    Alcohol/week: 1.2 oz    Types: 2 Shots of liquor per week  . Drug use: No  . Sexual activity: Yes    Partners: Male    Birth control/protection: Surgical, Post-menopausal    Comment: BTL  Lifestyle  . Physical activity:    Days per week: Not on file    Minutes per session: Not on file  . Stress: Not on file  Relationships  . Social connections:    Talks on phone: Not on file    Gets together: Not on file    Attends religious service: Not on file    Active member of club or organization: Not on file    Attends meetings of clubs or organizations: Not on file    Relationship status: Not on  file  . Intimate partner violence:    Fear of current or ex partner: Not on file    Emotionally abused: Not on file    Physically abused: Not on file    Forced sexual activity: Not on file  Other Topics Concern  . Not on file  Social History Narrative  . Not on file    Family History  Problem Relation Age of Onset  . Diabetes Mother   . Hypertension Mother   . Hyperlipidemia Mother   . Cancer Father        LUNG  . Colon polyps Father   . Heart disease Maternal Grandmother   . Colon cancer Neg Hx   . Esophageal cancer Neg Hx   . Stomach cancer Neg Hx   . Rectal cancer Neg Hx      Review of Systems  Constitutional: Negative.  Negative for chills, fever and weight loss.  HENT: Positive for sore throat.   Eyes: Negative.   Respiratory: Negative.  Negative for cough and shortness of breath.   Cardiovascular: Negative.  Negative for chest pain, palpitations and leg swelling.  Gastrointestinal: Positive for abdominal pain, heartburn and nausea. Negative for blood in stool, constipation, diarrhea, melena and vomiting.  Genitourinary: Negative.  Negative for dysuria and hematuria.  Musculoskeletal: Negative.  Negative for back pain, myalgias and neck pain.  Skin: Negative.  Negative for rash.  Neurological: Negative.  Negative for dizziness and headaches.  Endo/Heme/Allergies: Negative.   All other systems reviewed and are negative.   Vitals:   06/16/18 1459  BP: 133/71  Pulse: 80  Resp: 17  Temp: 98.4 F (36.9 C)  SpO2: 98%    Physical Exam  Constitutional: She is oriented to person, place, and time. She appears well-developed and well-nourished.  HENT:  Head: Normocephalic and atraumatic.  Nose: Nose normal.  Mouth/Throat: Oropharynx is clear and moist.  Eyes: Pupils are equal, round, and reactive to light. Conjunctivae and EOM are normal.  Neck: Normal range of motion. Neck supple.  Cardiovascular: Normal rate, regular rhythm and normal heart sounds.    Pulmonary/Chest: Effort normal and breath sounds normal.  Abdominal: Soft. Bowel sounds are normal. There is no hepatosplenomegaly. There is tenderness in the epigastric area. There is no rigidity, no rebound and no guarding. Hernia confirmed negative in the ventral area.  Musculoskeletal: Normal range of motion. She exhibits no edema or tenderness.  Neurological: She is  alert and oriented to person, place, and time. No sensory deficit. She exhibits normal muscle tone.  Skin: Skin is warm and dry. Capillary refill takes less than 2 seconds. No rash noted.  Psychiatric: She has a normal mood and affect. Her behavior is normal.  Vitals reviewed.    ASSESSMENT & PLAN: Suleika was seen today for dysphagia and bloating.  Diagnoses and all orders for this visit:  Bilateral upper abdominal discomfort -     CBC with Differential/Platelet -     Comprehensive metabolic panel -     Lipase -     TSH -     ranitidine (ZANTAC) 300 MG tablet; Take 1 tablet (300 mg total) by mouth at bedtime for 14 days. -     Ambulatory referral to Gastroenterology  Need for diphtheria-tetanus-pertussis (Tdap) vaccine -     Tdap vaccine greater than or equal to 7yo IM  History of celiac disease -     Ambulatory referral to Gastroenterology    Patient Instructions       IF you received an x-ray today, you will receive an invoice from [facility]. Please contact [facility] at [phone] with questions or concerns regarding your invoice.   IF you received labwork today, you will receive an invoice from [place]. Please contact [facility] at [phone] with questions or concerns regarding your invoice.   Our billing staff will not be able to assist you with questions regarding bills from these companies.  You will be contacted with the lab results as soon as they are available. The fastest way to get your results is to activate your My Chart account. Instructions are located on the last  page of this paperwork. If you have not heard from [place] regarding the results in 2 weeks, please contact this office.      Abdominal Pain, Adult Many things can cause belly (abdominal) pain. Most times, belly pain is not dangerous. Many cases of belly pain can be watched and treated at home. Sometimes belly pain is serious, though. Your doctor will try to find the cause of your belly pain. Follow these instructions at home:  Take over-the-counter and prescription medicines only as told by your doctor. Do not take medicines that help you poop (laxatives) unless told to by your doctor.  Drink enough fluid to keep your pee (urine) clear or pale yellow.  Watch your belly pain for any changes.  Keep all follow-up visits as told by your doctor. This is important. Contact a doctor if:  Your belly pain changes or gets worse.  You are not hungry, or you lose weight without trying.  You are having trouble pooping (constipated) or have watery poop (diarrhea) for more than 2-3 days.  You have pain when you pee or poop.  Your belly pain wakes you up at night.  Your pain gets worse with meals, after eating, or with certain foods.  You are throwing up and cannot keep anything down.  You have a fever. Get help right away if:  Your pain does not go away as soon as your doctor says it should.  You cannot stop throwing up.  Your pain is only in areas of your belly, such as the right side or the left lower part of the belly.  You have bloody or black poop, or poop that looks like tar.  You have very bad pain, cramping, or bloating in your belly.  You have signs of not having enough fluid or water in  your body (dehydration), such as: ? Dark pee, very little pee, or no pee. ? Cracked lips. ? Dry mouth. ? Sunken eyes. ? Sleepiness. ? Weakness. This information is not intended to replace advice given to you by your health care provider. Make sure you discuss any questions you have with your  health care provider. Document Released: 05/27/2008 Document Revised: 06/28/2016 Document Reviewed: 05/22/2016 Elsevier Interactive Patient Education  2018 Elsevier Inc.      Agustina Caroli, MD [facility]

## 2018-06-16 NOTE — Patient Instructions (Addendum)
     IF you received an x-ray today, you will receive an invoice from [facility]. Please contact [facility] at [phone] with questions or concerns regarding your invoice.   IF you received labwork today, you will receive an invoice from [place]. Please contact [facility] at [phone] with questions or concerns regarding your invoice.   Our billing staff will not be able to assist you with questions regarding bills from these companies.  You will be contacted with the lab results as soon as they are available. The fastest way to get your results is to activate your My Chart account. Instructions are located on the last page of this paperwork. If you have not heard from [place] regarding the results in 2 weeks, please contact this office.      Abdominal Pain, Adult Many things can cause belly (abdominal) pain. Most times, belly pain is not dangerous. Many cases of belly pain can be watched and treated at home. Sometimes belly pain is serious, though. Your doctor will try to find the cause of your belly pain. Follow these instructions at home:  Take over-the-counter and prescription medicines only as told by your doctor. Do not take medicines that help you poop (laxatives) unless told to by your doctor.  Drink enough fluid to keep your pee (urine) clear or pale yellow.  Watch your belly pain for any changes.  Keep all follow-up visits as told by your doctor. This is important. Contact a doctor if:  Your belly pain changes or gets worse.  You are not hungry, or you lose weight without trying.  You are having trouble pooping (constipated) or have watery poop (diarrhea) for more than 2-3 days.  You have pain when you pee or poop.  Your belly pain wakes you up at night.  Your pain gets worse with meals, after eating, or with certain foods.  You are throwing up and cannot keep anything down.  You have a fever. Get help right away if:  Your pain does not go  away as soon as your doctor says it should.  You cannot stop throwing up.  Your pain is only in areas of your belly, such as the right side or the left lower part of the belly.  You have bloody or black poop, or poop that looks like tar.  You have very bad pain, cramping, or bloating in your belly.  You have signs of not having enough fluid or water in your body (dehydration), such as: ? Dark pee, very little pee, or no pee. ? Cracked lips. ? Dry mouth. ? Sunken eyes. ? Sleepiness. ? Weakness. This information is not intended to replace advice given to you by your health care provider. Make sure you discuss any questions you have with your health care provider. Document Released: 05/27/2008 Document Revised: 06/28/2016 Document Reviewed: 05/22/2016 Elsevier Interactive Patient Education  2018 [employer].

## 2018-06-17 ENCOUNTER — Encounter: Payer: Self-pay | Admitting: Radiology

## 2018-06-17 LAB — CBC WITH DIFFERENTIAL/PLATELET
BASOS: 0 %
Basophils Absolute: 0 10*3/uL (ref 0.0–0.2)
EOS (ABSOLUTE): 0 10*3/uL (ref 0.0–0.4)
Eos: 1 %
HEMATOCRIT: 43 % (ref 34.0–46.6)
HEMOGLOBIN: 14.4 g/dL (ref 11.1–15.9)
IMMATURE GRANS (ABS): 0 10*3/uL (ref 0.0–0.1)
Immature Granulocytes: 0 %
LYMPHS: 38 %
Lymphocytes Absolute: 2.6 10*3/uL (ref 0.7–3.1)
MCH: 32.2 pg (ref 26.6–33.0)
MCHC: 33.5 g/dL (ref 31.5–35.7)
MCV: 96 fL (ref 79–97)
MONOCYTES: 6 %
Monocytes Absolute: 0.4 10*3/uL (ref 0.1–0.9)
NEUTROS ABS: 3.7 10*3/uL (ref 1.4–7.0)
Neutrophils: 55 %
Platelets: 294 10*3/uL (ref 150–450)
RBC: 4.47 x10E6/uL (ref 3.77–5.28)
RDW: 12.8 % (ref 12.3–15.4)
WBC: 6.8 10*3/uL (ref 3.4–10.8)

## 2018-06-17 LAB — COMPREHENSIVE METABOLIC PANEL
A/G RATIO: 2.4 — AB (ref 1.2–2.2)
ALBUMIN: 4.6 g/dL (ref 3.5–5.5)
ALT: 16 IU/L (ref 0–32)
AST: 19 IU/L (ref 0–40)
Alkaline Phosphatase: 77 IU/L (ref 39–117)
BUN / CREAT RATIO: 16 (ref 9–23)
BUN: 10 mg/dL (ref 6–24)
Bilirubin Total: 0.3 mg/dL (ref 0.0–1.2)
CO2: 23 mmol/L (ref 20–29)
Calcium: 10.1 mg/dL (ref 8.7–10.2)
Chloride: 101 mmol/L (ref 96–106)
Creatinine, Ser: 0.62 mg/dL (ref 0.57–1.00)
GFR calc non Af Amer: 101 mL/min/{1.73_m2} (ref >59)
GFR, EST AFRICAN AMERICAN: 117 mL/min/{1.73_m2} (ref >59)
GLOBULIN, TOTAL: 1.9 g/dL (ref 1.5–4.5)
Glucose: 92 mg/dL (ref 65–99)
POTASSIUM: 3.9 mmol/L (ref 3.5–5.2)
Sodium: 140 mmol/L (ref 134–144)
TOTAL PROTEIN: 6.5 g/dL (ref 6.0–8.5)

## 2018-06-17 LAB — TSH: TSH: 1.56 u[IU]/mL (ref 0.450–4.500)

## 2018-06-17 LAB — LIPASE: Lipase: 27 U/L (ref 14–72)

## 2018-07-03 ENCOUNTER — Encounter

## 2018-07-03 ENCOUNTER — Ambulatory Visit: Payer: BLUE CROSS/BLUE SHIELD | Admitting: Gastroenterology

## 2018-07-03 ENCOUNTER — Encounter: Payer: Self-pay | Admitting: Gastroenterology

## 2018-07-03 VITALS — BP 106/68 | HR 72 | Ht 64.0 in | Wt 147.8 lb

## 2018-07-03 DIAGNOSIS — R110 Nausea: Secondary | ICD-10-CM

## 2018-07-03 DIAGNOSIS — R1013 Epigastric pain: Secondary | ICD-10-CM

## 2018-07-03 DIAGNOSIS — R142 Eructation: Secondary | ICD-10-CM | POA: Diagnosis not present

## 2018-07-03 DIAGNOSIS — R140 Abdominal distension (gaseous): Secondary | ICD-10-CM

## 2018-07-03 MED ORDER — SUCRALFATE 1 G PO TABS: 1.0000 g | ORAL_TABLET | Freq: Three times a day (TID) | ORAL | 1 refills | Status: DC

## 2018-07-03 NOTE — Patient Instructions (Signed)
We have sent the following medications to your pharmacy for you to pick up at your convenience: Carafate tablets before meals and at bedtime  You have been scheduled for an endoscopy. Please follow written instructions given to you at your visit today. If you use inhalers (even only as needed), please bring them with you on the day of your procedure. Your physician has requested that you go to www.startemmi.com and enter the access code given to you at your visit today. This web site gives a general overview about your procedure. However, you should [place] follow specific instructions given to you by our office regarding your preparation for the procedure.

## 2018-07-03 NOTE — Progress Notes (Signed)
07/03/2018 Victoria Mann [id number] 05-04-1961   HISTORY OF PRESENT ILLNESS:  This is a 57 year old female with complaints of epigastric abdominal pain, belching, bloating, and nausea.  Symptoms have been present since the beginning of June.  Never had similar symptoms in the past.  Pain has woken her from sleep on 4 occasions.  Makes her nauseated when she bends over.  Feels full and bloated.  Was previously taking NSAID's for frozen shoulder prior to the onset of these symptoms.  Zantac 300 mg daily x 2 weeks did not help and she is allergic to PPI's.   Has celiac disease diagnosed in 1995 and follows a strict gluten free diet.    Past Medical History:  Diagnosis Date  . Allergy   . Celiac sprue   . Psoriasis   . Vitamin D deficiency   . Wears dentures    upper only   Past Surgical History:  Procedure Laterality Date  . KNEE ARTHROSCOPY  1985   LEFT  . PELVIC LAPAROSCOPY  Jan 2012   To remove scar tissue  . TUBAL LIGATION  1993  . UMBILICAL HERNIA REPAIR  Jan 2010  . UPPER GI ENDOSCOPY  2009   f/u celiac     reports that she has been smoking cigarettes.  She has a 19.00 pack-year smoking history. She has never used smokeless tobacco. She reports that she drinks about 1.2 oz of alcohol per week. She reports that she does not use drugs. family history includes Cancer in her father; Colon polyps in her father; Diabetes in her mother; Heart disease in her maternal grandmother; Hyperlipidemia in her mother; Hypertension in her mother. Allergies  Allergen Reactions  . Esomeprazole Magnesium Hives  . Lansoprazole Hives  . Omeprazole Hives    REACTION: rash      Outpatient Encounter Medications as of 07/03/2018  Medication Sig  . albuterol (PROVENTIL HFA;VENTOLIN HFA) 108 (90 Base) MCG/ACT inhaler Inhale 2 puffs into the lungs every 6 (six) hours as needed for wheezing or shortness of breath.  . diphenhydrAMINE (BENADRYL) 25 MG tablet Take 25 mg by mouth every 6 (six) hours  as needed for allergies.  Marland Kitchen ibuprofen (ADVIL,MOTRIN) 400 MG tablet Take 400 mg by mouth every 6 (six) hours as needed for fever, headache, mild pain or cramping.  . Multiple Vitamin (MULTIVITAMIN) tablet Take 1 tablet by mouth daily.  . Omega-3 Fatty Acids (FISH OIL) 1000 MG CAPS Take 2,000 mg by mouth daily.  Marland Kitchen VITAMIN D, CHOLECALCIFEROL, PO Take 20,000 Units by mouth daily.  . ranitidine (ZANTAC) 300 MG tablet Take 1 tablet (300 mg total) by mouth at bedtime for 14 days.  . [DISCONTINUED] Vitamin D, Ergocalciferol, (DRISDOL) 50000 units CAPS capsule Take 1 capsule (50,000 Units total) by mouth every 7 (seven) days. Recheck Vit D level at end of Rx.   Facility-Administered Encounter Medications as of 07/03/2018  Medication  . 0.9 %  sodium chloride infusion     REVIEW OF SYSTEMS  : All other systems reviewed and negative except where noted in the History of Present Illness.   PHYSICAL EXAM: Ht 5' 4"  (1.626 m)   Wt 147 lb 12.8 oz (67 kg)   LMP 09/24/2013   BMI 25.37 kg/m  General: Well developed white female in no acute distress Head: Normocephalic and atraumatic Eyes:  Sclerae anicteric, conjunctiva pink. Ears: Normal auditory acuity Lungs: Clear throughout to auscultation; no increased WOB. Heart: Regular rate and rhythm; no M/R/G. Abdomen:  Soft, non-distended.  BS present.  Moderate epigastric TTP. Musculoskeletal: Symmetrical with no gross deformities  Skin: No lesions on visible extremities Extremities: No edema  Neurological: Alert oriented x 4, grossly non-focal Psychological:  Alert and cooperative. Normal mood and affect  ASSESSMENT AND PLAN: *57 year old female with complaints of epigastric abdominal pain, belching, bloating, and nausea.  Was previously taking NSAID's for frozen shoulder prior to the onset of these symptoms.  Zantac 300 mg daily did not help and she is allergic to PPI's.  Will schedule for EGD for evaluation, rule out PUD, etc.  Will give carafate tab to  take ACHS in the interim.  If EGD negative and pain continues then may actually need CT scan abdomen.   CC:  Horald Pollen, [place]

## 2018-07-03 NOTE — Progress Notes (Signed)
I agree with the above note, plan 

## 2018-07-06 ENCOUNTER — Other Ambulatory Visit: Payer: Self-pay

## 2018-07-06 ENCOUNTER — Ambulatory Visit (AMBULATORY_SURGERY_CENTER): Payer: BLUE CROSS/BLUE SHIELD | Admitting: Gastroenterology

## 2018-07-06 ENCOUNTER — Encounter: Payer: Self-pay | Admitting: Gastroenterology

## 2018-07-06 VITALS — BP 135/77 | HR 73 | Temp 98.4°F | Resp 16 | Ht 64.0 in | Wt 147.0 lb

## 2018-07-06 DIAGNOSIS — K900 Celiac disease: Secondary | ICD-10-CM

## 2018-07-06 DIAGNOSIS — K259 Gastric ulcer, unspecified as acute or chronic, without hemorrhage or perforation: Secondary | ICD-10-CM | POA: Diagnosis not present

## 2018-07-06 DIAGNOSIS — K3189 Other diseases of stomach and duodenum: Secondary | ICD-10-CM | POA: Diagnosis not present

## 2018-07-06 DIAGNOSIS — R1013 Epigastric pain: Secondary | ICD-10-CM

## 2018-07-06 DIAGNOSIS — K2210 Ulcer of esophagus without bleeding: Secondary | ICD-10-CM | POA: Diagnosis not present

## 2018-07-06 DIAGNOSIS — K2970 Gastritis, unspecified, without bleeding: Secondary | ICD-10-CM | POA: Diagnosis not present

## 2018-07-06 DIAGNOSIS — K219 Gastro-esophageal reflux disease without esophagitis: Secondary | ICD-10-CM | POA: Diagnosis not present

## 2018-07-06 MED ORDER — RANITIDINE HCL 150 MG PO TABS: 150.0000 mg | ORAL_TABLET | Freq: Two times a day (BID) | ORAL | 11 refills | Status: DC

## 2018-07-06 MED ORDER — SODIUM CHLORIDE 0.9 % IV SOLN: 500.0000 mL | Freq: Once | INTRAVENOUS | Status: DC

## 2018-07-06 MED ORDER — SUCRALFATE 1 GM/10ML PO SUSP: ORAL | 1 refills | Status: DC

## 2018-07-06 NOTE — Progress Notes (Signed)
Report to PACU, RN, vss, BBS= Clear.  

## 2018-07-06 NOTE — Patient Instructions (Signed)
NO ASPIRIN,IBUPROFEN,NSPROXEN,OR OTHER NON STEROIDAL ANTI INFLAMMATORY DRUGS  RESUME PREVIOUS GLUTEN FREE DIET   INFORMATION ON GASTRIC REFLUX GIVEN TO YOU TODAY  SUCRALFATE SUSPENSION 1 GRAM BY MOUTH 4 TIMES A DAY FOR 14 DAYS  ZANTAC 150 MG BY MOUTH TWICE DAILY   FOLLOW UP IN GI CLINIC WITH JESSICA ZEHR IF Vert WITH PROBLEMS    YOU HAD AN ENDOSCOPIC PROCEDURE TODAY AT [place]:   Refer to the procedure report that was given to you for any specific questions about what was found during the examination.  If the procedure report does not answer your questions, please call your gastroenterologist to clarify.  If you requested that your care partner not be given the details of your procedure findings, then the procedure report has been included in a sealed envelope for you to review at your convenience later.  YOU SHOULD EXPECT: Some feelings of bloating in the abdomen. Passage of more gas than usual.  Walking can help get rid of the air that was put into your GI tract during the procedure and reduce the bloating. If you had a lower endoscopy (such as a colonoscopy or flexible sigmoidoscopy) you may notice spotting of blood in your stool or on the toilet paper. If you underwent a bowel prep for your procedure, you may not have a normal bowel movement for a few days.  Please Note:  You might notice some irritation and congestion in your nose or some drainage.  This is from the oxygen used during your procedure.  There is no need for concern and it should clear up in a day or so.  SYMPTOMS TO REPORT IMMEDIATELY:     Following upper endoscopy (EGD)  Vomiting of blood or coffee ground material  New chest pain or pain under the shoulder blades  Painful or persistently difficult swallowing  New shortness of breath  Fever of 100F or higher  Black, tarry-looking stools  For urgent or emergent issues, a gastroenterologist can be reached at any hour by calling [phone]  [phone].   DIET:  We do recommend a small meal at first, but then you may proceed to your regular diet.  Drink plenty of fluids but you should avoid alcoholic beverages for 24 hours.  ACTIVITY:  You should plan to take it easy for the rest of today and you should NOT DRIVE or use heavy machinery until tomorrow (because of the sedation medicines used during the test).    FOLLOW UP: Our staff will call the number listed on your records the next business day following your procedure to check on you and address any questions or concerns that you may have regarding the information given to you following your procedure. If we do not reach you, we will leave a message.  However, if you are feeling well and you are not experiencing any problems, there is no need to return our call.  We will assume that you have returned to your regular daily activities without incident.  If any biopsies were taken you will be contacted by phone or by letter within the next 1-3 weeks.  Please call us at [phone] if you have not heard about the biopsies in 3 weeks.    SIGNATURES/CONFIDENTIALITY: You and/or your care partner have signed paperwork which will be entered into your electronic medical record.  These signatures attest to the fact that that the information above on your After Visit Summary has been reviewed and is understood.  Full  responsibility of the confidentiality of this discharge information lies with you and/or your care-partner.

## 2018-07-06 NOTE — Op Note (Signed)
[facility] Patient Name: Ronnie Brooks Procedure Date: 07/06/2018 3:08 PM MRN: [id number] Endoscopist: Jackquline Denmark , MD Age: 57 Referring MD:  Date of Birth: 1961/12/21 Gender: Female Account #: [id number] Procedure:                Upper GI endoscopy Indications:              57 year old female with known celiac disease                            complaints of epigastric abdominal pain, belching,                            bloating, and nausea. Was previously taking NSAID's                            for frozen shoulder prior to the onset of these                            symptoms. Zantac 300 mg daily did not help and she                            is allergic to PPI's. Medicines:                Monitored Anesthesia Care Procedure:                Pre-Anesthesia Assessment:                           - Prior to the procedure, a History and Physical                            was performed, and patient medications and                            allergies were reviewed. The patient is competent.                            The risks and benefits of the procedure and the                            sedation options and risks were discussed with the                            patient. All questions were answered and informed                            consent was obtained. Patient identification and                            proposed procedure were verified by the physician                            in the procedure room. Mental Status Examination:  alert and oriented. Prophylactic Antibiotics: The                            patient does not require prophylactic antibiotics.                            Prior Anticoagulants: The patient has taken no                            previous anticoagulant or antiplatelet agents. ASA                            Grade Assessment: II - A patient with mild systemic                            disease. After reviewing  the risks and benefits,                            the patient was deemed in satisfactory condition to                            undergo the procedure. The anesthesia plan was to                            use monitored anesthesia care (MAC). Immediately                            prior to administration of medications, the patient                            was re-assessed for adequacy to receive sedatives.                            The heart rate, respiratory rate, oxygen                            saturations, blood pressure, adequacy of pulmonary                            ventilation, and response to care were monitored                            throughout the procedure. The physical status of                            the patient was re-assessed after the procedure.                           After obtaining informed consent, the endoscope was                            passed under direct vision. Throughout the  procedure, the patient's blood pressure, pulse, and                            oxygen saturations were monitored continuously. The                            Endoscope was introduced through the mouth, and                            advanced to the second part of duodenum. The upper                            GI endoscopy was accomplished without difficulty.                            The patient tolerated the procedure well. Scope In: Scope Out: Findings:                 LA Grade B (one or more mucosal breaks greater than                            5 mm, not extending between the tops of two mucosal                            folds) esophagitis with no bleeding was found 34 to                            35 cm from the incisors. Biopsies were taken with a                            cold forceps for histology. A small transient                            hiatal hernia was noted.                           Localized mild inflammation characterized  by                            erythema was found in the gastric antrum. Biopsies                            were taken with a cold forceps for histology.                           The in the duodenum was normal. Biopsies for                            histology were taken with a cold forceps for                            evaluation of celiac disease. Complications:            No immediate complications. Estimated  Blood Loss:     Estimated blood loss: none. Impression:               - LA Grade B reflux esophagitis with small hiatal                            hernia. Biopsied.                           - Mild gastritis. Biopsied. Recommendation:           - Patient has a contact number available for                            emergencies. The signs and symptoms of potential                            delayed complications were discussed with the                            patient. Return to normal activities tomorrow.                            Written discharge instructions were provided to the                            patient.                           - Resume previous gluten-free diet.                           - No aspirin, ibuprofen, naproxen, or other                            non-steroidal anti-inflammatory drugs.                           - Await pathology results.                           - Use sucralfate suspension 1 gram PO QID for 14                            days.                           - Would continue Zantac 150 mg by mouth twice a day.                           - Nonpharmacologic means of reflux control.                           - Follow-up in the GI clinic in 4-6 weeks (with                            Alonza Bogus) if [place] with problems.  Jackquline Denmark, MD 07/06/2018 3:41:56 PM This report has been signed electronically.

## 2018-07-06 NOTE — Progress Notes (Signed)
Called to room to assist during endoscopic procedure.  Patient ID and intended procedure confirmed with present staff. Received instructions for my participation in the procedure from the performing physician.  

## 2018-07-07 ENCOUNTER — Telehealth: Payer: Self-pay | Admitting: *Deleted

## 2018-07-07 NOTE — Telephone Encounter (Signed)
  Follow up Call-  Call back number 07/06/2018 11/06/2016  Post procedure Call Back phone  # [phone]  Permission to leave phone message Yes Yes  Some recent data might be hidden     Patient questions:  Do you have a fever, pain , or abdominal swelling? No. Pain Score  0 *  Have you tolerated food without any problems? Yes.    Have you been able to return to your normal activities? Yes.    Do you have any questions about your discharge instructions: Diet   No. Medications  No. Follow up visit  No.  Do you have questions or concerns about your Care? No.  Actions: * If pain score is 4 or above: No action needed, pain <4.

## 2018-07-09 ENCOUNTER — Telehealth: Payer: Self-pay | Admitting: Gastroenterology

## 2018-07-10 NOTE — Telephone Encounter (Signed)
Would you like to switch this to something else?

## 2018-07-12 ENCOUNTER — Encounter: Payer: Self-pay | Admitting: Gastroenterology

## 2018-07-12 NOTE — Telephone Encounter (Signed)
Does insurance cover Carafate tablets.  She was given these before If yes, 1g po qid x 2 weeks, then bid. Continue Zantac Of note that she is allergic to PPIs If [place] with problems, will try GI cocktail

## 2018-07-13 NOTE — Telephone Encounter (Signed)
Informed patient by phone she said that she already has this medication and she would try this again and call if she Saxer has problems.

## 2018-07-24 ENCOUNTER — Telehealth: Payer: Self-pay | Admitting: Gastroenterology

## 2018-07-24 NOTE — Telephone Encounter (Signed)
Patient wanting to know how long to stay on medications carafate and zantac. Patient also wanting to know if she needs to schedule follow up with Dr.Gupta or APP Jessica.

## 2018-07-24 NOTE — Telephone Encounter (Signed)
I spoke with the patient about her medications.  She has 1 refill on the carafate and then she will stop.  Report to our office if she return of symptoms.  11 refills on Zantac, she will schedule follow-up prior to prescription expiration to discuss continuation.

## 2018-08-19 ENCOUNTER — Encounter: Payer: Self-pay | Admitting: Gastroenterology

## 2018-09-16 ENCOUNTER — Ambulatory Visit: Payer: Self-pay | Admitting: Gastroenterology

## 2018-09-18 ENCOUNTER — Encounter: Payer: Self-pay | Admitting: Gastroenterology

## 2018-09-18 ENCOUNTER — Ambulatory Visit: Payer: BLUE CROSS/BLUE SHIELD | Admitting: Gastroenterology

## 2018-09-18 VITALS — BP 112/74 | HR 72 | Ht 63.39 in | Wt 146.2 lb

## 2018-09-18 DIAGNOSIS — K219 Gastro-esophageal reflux disease without esophagitis: Secondary | ICD-10-CM | POA: Diagnosis not present

## 2018-09-18 DIAGNOSIS — K209 Esophagitis, unspecified without bleeding: Secondary | ICD-10-CM

## 2018-09-18 MED ORDER — FAMOTIDINE 20 MG PO TABS: 20.0000 mg | ORAL_TABLET | Freq: Two times a day (BID) | ORAL | 11 refills | Status: DC

## 2018-09-18 NOTE — Progress Notes (Signed)
09/18/2018 Victoria Mann [id number] 1961/03/02   HISTORY OF PRESENT ILLNESS:  This is a patient of Dr. Ardis Hughs who is here for follow-up of her UGI symptoms.  Had EGD by Dr. Lyndel Safe in July that showed the following:  LA grade B esophagitis with small hiatal hernia and gastritis.  Duodenal biopsies normal.  Distal esophageal biopsies with ulceration c/w severe reflux, no Barrett's.  Gastric biopsies with reactive changes and focal intestinal metaplasia.  Due to this last finding it was recommended that she have a repeat EGD in 3 years.    Unfortunately she is allergic to PPIs.  Is currently on zantac 150 mg BID.  Took carafate for a few weeks but is no longer on that medication.  She tells me that she is feeling much better but [name] has some upper abdominal bloating.  She is no longer using NSAID's.  Past Medical History:  Diagnosis Date  . Allergy   . Celiac sprue   . Psoriasis   . Vitamin D deficiency   . Wears dentures    upper only   Past Surgical History:  Procedure Laterality Date  . KNEE ARTHROSCOPY  1985   LEFT  . PELVIC LAPAROSCOPY  Jan 2012   To remove scar tissue  . TUBAL LIGATION  1993  . UMBILICAL HERNIA REPAIR  Jan 2010  . UPPER GI ENDOSCOPY  2009   f/u celiac     reports that she has been smoking cigarettes. She has a 19.00 pack-year smoking history. She has never used smokeless tobacco. She reports that she drinks about 2.0 standard drinks of alcohol per week. She reports that she does not use drugs. family history includes Cancer in her father; Colon polyps in her father; Diabetes in her mother; Heart disease in her maternal grandmother; Hyperlipidemia in her mother; Hypertension in her mother. Allergies  Allergen Reactions  . Esomeprazole Magnesium Hives  . Lansoprazole Hives  . Omeprazole Hives    REACTION: rash      Outpatient Encounter Medications as of 09/18/2018  Medication Sig  . albuterol (PROVENTIL HFA;VENTOLIN HFA) 108 (90 Base) MCG/ACT  inhaler Inhale 2 puffs into the lungs every 6 (six) hours as needed for wheezing or shortness of breath.  . diphenhydrAMINE (BENADRYL) 25 MG tablet Take 25 mg by mouth every 6 (six) hours as needed for allergies.  . Multiple Vitamin (MULTIVITAMIN) tablet Take 1 tablet by mouth daily.  . Omega-3 Fatty Acids (FISH OIL) 1000 MG CAPS Take 2,000 mg by mouth daily.  . ranitidine (ZANTAC) 150 MG tablet Take 1 tablet (150 mg total) by mouth 2 (two) times daily.  . sucralfate (CARAFATE) 1 g tablet Take 1 tablet by mouth 4 (four) times daily.  Marland Kitchen VITAMIN D, CHOLECALCIFEROL, PO Take 20,000 Units by mouth daily.  . [DISCONTINUED] ibuprofen (ADVIL,MOTRIN) 400 MG tablet Take 400 mg by mouth every 6 (six) hours as needed for fever, headache, mild pain or cramping.  . [DISCONTINUED] ranitidine (ZANTAC) 300 MG tablet Take 1 tablet (300 mg total) by mouth at bedtime for 14 days.  . [DISCONTINUED] sucralfate (CARAFATE) 1 GM/10ML suspension SUCRALFATE SUSPENSION 1 GRAM PO QID FOR 14 DAYS   Facility-Administered Encounter Medications as of 09/18/2018  Medication  . 0.9 %  sodium chloride infusion  . 0.9 %  sodium chloride infusion     REVIEW OF SYSTEMS  : All other systems reviewed and negative except where noted in the History of Present Illness.   PHYSICAL EXAM: BP 112/74 (  BP Location: Left Arm, Patient Position: Sitting, Cuff Size: Normal)   Pulse 72   Ht 5' 3.39" (1.61 m) Comment: height measured without shoes  Wt 146 lb 4 oz (66.3 kg)   LMP 09/24/2013   BMI 25.59 kg/m  General: Well developed white female in no acute distress Head: Normocephalic and atraumatic Eyes:  Sclerae anicteric, conjunctiva pink. Ears: Normal auditory acuity Lungs: Clear throughout to auscultation; no increased WOB. Heart: Regular rate and rhythm; no M/R/G. Abdomen: Soft, non-distended.  BS present.  Non-tender. Musculoskeletal: Symmetrical with no gross deformities  Skin: No lesions on visible extremities Extremities: No  edema  Neurological: Alert oriented x 4, grossly non-focal Psychological:  Alert and cooperative. Normal mood and affect  ASSESSMENT AND PLAN: *57 year old female with GERD/esophagitis/gastritis:  Seen on recent EGD.  Allergic to PPIs.  Is on zantac 150 mg BID and took carafate for a few weeks.  No longer on NSAID's.  Symptoms now much improved with only some mild upper abdominal bloating.  Will discontinue zantac and switch to pepcid 20 mg BID.  She will call back for any other ongoing issues.   CC:  Horald Pollen, [place]

## 2018-09-18 NOTE — Patient Instructions (Signed)
If you are age 56 or older, your body mass index should be between 23-30. Your Body mass index is 25.59 kg/m. If this is out of the aforementioned range listed, please consider follow up with your Primary Care Provider.  If you are age 69 or younger, your body mass index should be between 19-25. Your Body mass index is 25.59 kg/m. If this is out of the aformentioned range listed, please consider follow up with your Primary Care Provider.   We have sent the following medications to your pharmacy for you to pick up at your convenience: Pepcid 20 mg  DISCONTINUE ZANTAC.  Call back for any other issues.  Thank you for choosing me and [facility].  Alonza Bogus, PA-C

## 2018-09-30 DIAGNOSIS — K219 Gastro-esophageal reflux disease without esophagitis: Secondary | ICD-10-CM | POA: Insufficient documentation

## 2018-09-30 DIAGNOSIS — K209 Esophagitis, unspecified without bleeding: Secondary | ICD-10-CM | POA: Insufficient documentation

## 2018-10-01 ENCOUNTER — Telehealth: Payer: Self-pay

## 2018-10-01 NOTE — Telephone Encounter (Signed)
-----   Message from Loralie Champagne, PA-C sent at 10/01/2018  8:59 AM EDT ----- Will you please let the patient know that I discussed with Dr. Ardis Hughs and he agrees that she does not need another EGD in 3 years as we discussed at her visit.  Please remove her EGD recall.  It should be a colon recall only in 2022 I believe.  Thank you,  Jess   ----- Message ----- From: Milus Banister, MD Sent: 10/01/2018   7:31 AM EDT To: Laban Emperor Zehr, PA-C  I don't think she needs EGD recall.  tahnsk  ----- Message ----- From: Loralie Champagne, PA-C Sent: 09/30/2018   3:12 PM EDT To: Milus Banister, MD  Hey.  I just wanted to double check on repeat EGD for this patient.  Dr. Lyndel Safe performed EGD in July.  Gastric biopsies showed focal intestinal metaplasia and he put her in for a 3 year EGD recall.  Just wanted to see if that was standard.  I feel like that is not something I have seen regularly.  Thank you,  Jess

## 2018-10-01 NOTE — Telephone Encounter (Signed)
The pt has been advised and recall was changed to Colon only

## 2018-10-01 NOTE — Progress Notes (Signed)
I agree with the above note, plan.  Since she is feeling better I do think she needs surveillance EGD

## 2018-10-06 ENCOUNTER — Other Ambulatory Visit: Payer: Self-pay | Admitting: Women's Health

## 2018-10-06 DIAGNOSIS — Z1231 Encounter for screening mammogram for malignant neoplasm of breast: Secondary | ICD-10-CM

## 2018-11-11 ENCOUNTER — Ambulatory Visit
Admission: RE | Admit: 2018-11-11 | Discharge: 2018-11-11 | Disposition: A | Discharge status: Home / Self Care | Payer: BLUE CROSS/BLUE SHIELD | Source: Ambulatory Visit | Attending: Women's Health | Admitting: Women's Health

## 2018-11-11 DIAGNOSIS — Z1231 Encounter for screening mammogram for malignant neoplasm of breast: Secondary | ICD-10-CM

## 2018-11-16 ENCOUNTER — Other Ambulatory Visit: Payer: Self-pay | Admitting: *Deleted

## 2018-11-16 MED ORDER — FAMOTIDINE 20 MG PO TABS: 20.0000 mg | ORAL_TABLET | Freq: Two times a day (BID) | ORAL | 3 refills | Status: DC

## 2018-11-17 ENCOUNTER — Telehealth: Payer: Self-pay | Admitting: *Deleted

## 2018-11-17 NOTE — Telephone Encounter (Signed)
On 11-16-2018, [facility] Famotidine tablets 20 mg, 90 day supply.

## 2018-11-24 ENCOUNTER — Ambulatory Visit: Payer: BLUE CROSS/BLUE SHIELD | Admitting: Women's Health

## 2018-11-24 ENCOUNTER — Encounter: Payer: Self-pay | Admitting: Women's Health

## 2018-11-24 VITALS — BP 128/80 | Ht 64.0 in | Wt 138.0 lb

## 2018-11-24 DIAGNOSIS — Z01419 Encounter for gynecological examination (general) (routine) without abnormal findings: Secondary | ICD-10-CM | POA: Diagnosis not present

## 2018-11-24 DIAGNOSIS — R87610 Atypical squamous cells of undetermined significance on cytologic smear of cervix (ASC-US): Secondary | ICD-10-CM | POA: Diagnosis not present

## 2018-11-24 NOTE — Progress Notes (Signed)
Victoria Mann [birth year] [id number]    History:    Presents for annual exam.  Postmenopausal on no HRT with no bleeding.  Age 57 cryo with normal Paps after.  Normal mammogram history.  2017 benign colon polyp 5-year follow-up.  06/2018 endoscopy gastritis, several ulcers has been on Pepcid and doing better.  Primary care manages  labs.  Smokes less than half pack daily.  Has not had a bone density.  Past medical history, past surgical history, family history and social history were all reviewed and documented in the EPIC chart.  Works for [employer].  Husband MI doing better.  3 children all doing well.  ROS:  A ROS was performed and pertinent positives and negatives are included.  Exam:  Vitals:   11/24/18 1609  BP: 128/80  Weight: 138 lb (62.6 kg)  Height: 5' 4"  (1.626 m)   Body mass index is 23.69 kg/m.   General appearance:  Normal Thyroid:  Symmetrical, normal in size, without palpable masses or nodularity. Respiratory  Auscultation:  Clear without wheezing or rhonchi Cardiovascular  Auscultation:  Regular rate, without rubs, murmurs or gallops  Edema/varicosities:  Not grossly evident Abdominal  Soft,nontender, without masses, guarding or rebound.  Liver/spleen:  No organomegaly noted  Hernia:  None appreciated  Skin  Inspection:  Grossly normal several psoriatic spots on legs   Breasts: Examined lying and sitting.     Right: Without masses, retractions, discharge or axillary adenopathy.     Left: Without masses, retractions, discharge or axillary adenopathy. Gentitourinary   Inguinal/mons:  Normal without inguinal adenopathy  External genitalia:  Normal  BUS/Urethra/Skene's glands:  Normal  Vagina:  Normal  Cervix:  Normal  Uterus:  normal in size, shape and contour.  Midline and mobile  Adnexa/parametria:     Rt: Without masses or tenderness.   Lt: Without masses or tenderness.  Anus and perineum: Normal  Digital rectal exam: Normal sphincter tone without  palpated masses or tenderness  Assessment/Plan:  57 y.o. MWF G4, P2 +1 adopted daughter for annual exam with no complaints.    Postmenopausal/no HRT/no bleeding GERD/stomach ulcers/celiac-GI managing Labs primary care Smoker less than half pack daily  Plan: DEXA reviewed states insurance does not cover until 74.  Reviewed importance of weightbearing and balance type exercise, yoga encouraged.  Home safety, fall prevention discussed.  SBE's, continue annual screening mammogram, calcium rich foods, vitamin D 2000 daily encouraged.  Aware of hazards of smoking, tips to quit reviewed.  Pap with HR HPV typing, new screening guidelines reviewed.    Victoria Mann [facility], 5:05 PM 11/24/2018

## 2018-11-24 NOTE — Patient Instructions (Signed)
Food Choices for Gastroesophageal Reflux Disease, Adult When you have gastroesophageal reflux disease (GERD), the foods you eat and your eating habits are very important. Choosing the right foods can help ease your discomfort. What guidelines do I need to follow?  Choose fruits, vegetables, whole grains, and low-fat dairy products.  Choose low-fat meat, fish, and poultry.  Limit fats such as oils, salad dressings, butter, nuts, and avocado.  Keep a food diary. This helps you identify foods that cause symptoms.  Avoid foods that cause symptoms. These may be different for everyone.  Eat small meals often instead of 3 large meals a day.  Eat your meals slowly, in a place where you are relaxed.  Limit fried foods.  Cook foods using methods other than frying.  Avoid drinking alcohol.  Avoid drinking large amounts of liquids with your meals.  Avoid bending over or lying down until 2-3 hours after eating. What foods are not recommended? These are some foods and drinks that may make your symptoms worse: Vegetables Tomatoes. Tomato juice. Tomato and spaghetti sauce. Chili peppers. Onion and garlic. Horseradish. Fruits Oranges, grapefruit, and lemon (fruit and juice). Meats High-fat meats, fish, and poultry. This includes hot dogs, ribs, ham, sausage, salami, and bacon. Dairy Whole milk and chocolate milk. Sour cream. Cream. Butter. Ice cream. Cream cheese. Drinks Coffee and tea. Bubbly (carbonated) drinks or energy drinks. Condiments Hot sauce. Barbecue sauce. Sweets/Desserts Chocolate and cocoa. Donuts. Peppermint and spearmint. Fats and Oils High-fat foods. This includes [place] fries and potato chips. Other Vinegar. Strong spices. This includes black pepper, white pepper, red pepper, cayenne, curry powder, cloves, ginger, and chili powder. The items listed above may not be a complete list of foods and drinks to avoid. Contact your dietitian for more information. This  information is not intended to replace advice given to you by your health care provider. Make sure you discuss any questions you have with your health care provider. Document Released: 06/09/2012 Document Revised: 05/16/2016 Document Reviewed: 10/13/2013 Elsevier Interactive Patient Education  2017 [employer] Maintenance for Postmenopausal Women Menopause is a normal process in which your reproductive ability comes to an end. This process happens gradually over a span of months to years, usually between the ages of 88 and 35. Menopause is complete when you have missed 12 consecutive menstrual periods. It is important to talk with your health care provider about some of the most common conditions that affect postmenopausal women, such as heart disease, cancer, and bone loss (osteoporosis). Adopting a healthy lifestyle and getting preventive care can help to promote your health and wellness. Those actions can also lower your chances of developing some of these common conditions. What should I know about menopause? During menopause, you may experience a number of symptoms, such as:  Moderate-to-severe hot flashes.  Night sweats.  Decrease in sex drive.  Mood swings.  Headaches.  Tiredness.  Irritability.  Memory problems.  Insomnia.  Choosing to treat or not to treat menopausal changes is an individual decision that you make with your health care provider. What should I know about hormone replacement therapy and supplements? Hormone therapy products are effective for treating symptoms that are associated with menopause, such as hot flashes and night sweats. Hormone replacement carries certain risks, especially as you become older. If you are thinking about using estrogen or estrogen with progestin treatments, discuss the benefits and risks with your health care provider. What should I know about heart disease and stroke? Heart disease, heart attack, and  stroke become more  likely as you age. This may be due, in part, to the hormonal changes that your body experiences during menopause. These can affect how your body processes dietary fats, triglycerides, and cholesterol. Heart attack and stroke are both medical emergencies. There are many things that you can do to help prevent heart disease and stroke:  Have your blood pressure checked at least every 1-2 years. High blood pressure causes heart disease and increases the risk of stroke.  If you are 44-16 years old, ask your health care provider if you should take aspirin to prevent a heart attack or a stroke.  Do not use any tobacco products, including cigarettes, chewing tobacco, or electronic cigarettes. If you need help quitting, ask your health care provider.  It is important to eat a healthy diet and maintain a healthy weight. ? Be sure to include plenty of vegetables, fruits, low-fat dairy products, and lean protein. ? Avoid eating foods that are high in solid fats, added sugars, or salt (sodium).  Get regular exercise. This is one of the most important things that you can do for your health. ? Try to exercise for at least 150 minutes each week. The type of exercise that you do should increase your heart rate and make you sweat. This is known as moderate-intensity exercise. ? Try to do strengthening exercises at least twice each week. Do these in addition to the moderate-intensity exercise.  Know your numbers.Ask your health care provider to check your cholesterol and your blood glucose. Continue to have your blood tested as directed by your health care provider.  What should I know about cancer screening? There are several types of cancer. Take the following steps to reduce your risk and to catch any cancer development as early as possible. Breast Cancer  Practice breast self-awareness. ? This means understanding how your breasts normally appear and feel. ? It also means doing regular breast self-exams.  Let your health care provider know about any changes, no matter how small.  If you are 79 or older, have a clinician do a breast exam (clinical breast exam or CBE) every year. Depending on your age, family history, and medical history, it may be recommended that you also have a yearly breast X-ray (mammogram).  If you have a family history of breast cancer, talk with your health care provider about genetic screening.  If you are at high risk for breast cancer, talk with your health care provider about having an MRI and a mammogram every year.  Breast cancer (BRCA) gene test is recommended for women who have family members with BRCA-related cancers. Results of the assessment will determine the need for genetic counseling and BRCA1 and for BRCA2 testing. BRCA-related cancers include these types: ? Breast. This occurs in males or females. ? Ovarian. ? Tubal. This may also be called fallopian tube cancer. ? Cancer of the abdominal or pelvic lining (peritoneal cancer). ? Prostate. ? Pancreatic.  Cervical, Uterine, and Ovarian Cancer Your health care provider may recommend that you be screened regularly for cancer of the pelvic organs. These include your ovaries, uterus, and vagina. This screening involves a pelvic exam, which includes checking for microscopic changes to the surface of your cervix (Pap test).  For women ages 21-65, health care providers may recommend a pelvic exam and a Pap test every three years. For women ages 14-65, they may recommend the Pap test and pelvic exam, combined with testing for human papilloma virus (HPV), every five years.  Some types of HPV increase your risk of cervical cancer. Testing for HPV may also be done on women of any age who have unclear Pap test results.  Other health care providers may not recommend any screening for nonpregnant women who are considered low risk for pelvic cancer and have no symptoms. Ask your health care provider if a screening pelvic exam  is right for you.  If you have had past treatment for cervical cancer or a condition that could lead to cancer, you need Pap tests and screening for cancer for at least 20 years after your treatment. If Pap tests have been discontinued for you, your risk factors (such as having a new sexual partner) need to be reassessed to determine if you should start having screenings again. Some women have medical problems that increase the chance of getting cervical cancer. In these cases, your health care provider may recommend that you have screening and Pap tests more often.  If you have a family history of uterine cancer or ovarian cancer, talk with your health care provider about genetic screening.  If you have vaginal bleeding after reaching menopause, tell your health care provider.  There are currently no reliable tests available to screen for ovarian cancer.  Lung Cancer Lung cancer screening is recommended for adults 41-20 years old who are at high risk for lung cancer because of a history of smoking. A yearly low-dose CT scan of the lungs is recommended if you:  Currently smoke.  Have a history of at least 30 pack-years of smoking and you currently smoke or have quit within the past 15 years. A pack-year is smoking an average of one pack of cigarettes per day for one year.  Yearly screening should:  Continue until it has been 15 years since you quit.  Stop if you develop a health problem that would prevent you from having lung cancer treatment.  Colorectal Cancer  This type of cancer can be detected and can often be prevented.  Routine colorectal cancer screening usually begins at age 18 and continues through age 48.  If you have risk factors for colon cancer, your health care provider may recommend that you be screened at an earlier age.  If you have a family history of colorectal cancer, talk with your health care provider about genetic screening.  Your health care provider may also  recommend using home test kits to check for hidden blood in your stool.  A small camera at the end of a tube can be used to examine your colon directly (sigmoidoscopy or colonoscopy). This is done to check for the earliest forms of colorectal cancer.  Direct examination of the colon should be repeated every 5-10 years until age 49. However, if early forms of precancerous polyps or small growths are found or if you have a family history or genetic risk for colorectal cancer, you may need to be screened more often.  Skin Cancer  Check your skin from head to toe regularly.  Monitor any moles. Be sure to tell your health care provider: ? About any new moles or changes in moles, especially if there is a change in a mole's shape or color. ? If you have a mole that is larger than the size of a pencil eraser.  If any of your family members has a history of skin cancer, especially at a Meloni Hinz age, talk with your health care provider about genetic screening.  Always use sunscreen. Apply sunscreen liberally and repeatedly throughout the  day.  Whenever you are outside, protect yourself by wearing long sleeves, pants, a wide-brimmed hat, and sunglasses.  What should I know about osteoporosis? Osteoporosis is a condition in which bone destruction happens more quickly than new bone creation. After menopause, you may be at an increased risk for osteoporosis. To help prevent osteoporosis or the bone fractures that can happen because of osteoporosis, the following is recommended:  If you are 33-32 years old, get at least 1,000 mg of calcium and at least 600 mg of vitamin D per day.  If you are older than age 45 but younger than age 48, get at least 1,200 mg of calcium and at least 600 mg of vitamin D per day.  If you are older than age 19, get at least 1,200 mg of calcium and at least 800 mg of vitamin D per day.  Smoking and excessive alcohol intake increase the risk of osteoporosis. Eat foods that are  rich in calcium and vitamin D, and do weight-bearing exercises several times each week as directed by your health care provider. What should I know about how menopause affects my mental health? Depression may occur at any age, but it is more common as you become older. Common symptoms of depression include:  Low or sad mood.  Changes in sleep patterns.  Changes in appetite or eating patterns.  Feeling an overall lack of motivation or enjoyment of activities that you previously enjoyed.  Frequent crying spells.  Talk with your health care provider if you think that you are experiencing depression. What should I know about immunizations? It is important that you get and maintain your immunizations. These include:  Tetanus, diphtheria, and pertussis (Tdap) booster vaccine.  Influenza every year before the flu season begins.  Pneumonia vaccine.  Shingles vaccine.  Your health care provider may also recommend other immunizations. This information is not intended to replace advice given to you by your health care provider. Make sure you discuss any questions you have with your health care provider. Document Released: 01/31/2006 Document Revised: 06/28/2016 Document Reviewed: 09/12/2015 Elsevier Interactive Patient Education  2018 [employer].

## 2018-11-25 NOTE — Addendum Note (Signed)
Addended by: Lorine Bears on: 11/25/2018 08:10 AM   Modules accepted: Orders

## 2018-11-27 LAB — PAP, TP IMAGING W/ HPV RNA, RFLX HPV TYPE 16,18/45: HPV DNA High Risk: NOT DETECTED

## 2019-11-29 ENCOUNTER — Other Ambulatory Visit: Payer: Self-pay

## 2019-11-29 ENCOUNTER — Ambulatory Visit (INDEPENDENT_AMBULATORY_CARE_PROVIDER_SITE_OTHER): Payer: BC Managed Care – PPO | Admitting: Emergency Medicine

## 2019-11-29 ENCOUNTER — Encounter: Payer: Self-pay | Admitting: Emergency Medicine

## 2019-11-29 ENCOUNTER — Ambulatory Visit (INDEPENDENT_AMBULATORY_CARE_PROVIDER_SITE_OTHER): Payer: BC Managed Care – PPO

## 2019-11-29 VITALS — BP 118/68 | HR 74 | Temp 98.3°F | Resp 16 | Ht 64.0 in | Wt 133.6 lb

## 2019-11-29 DIAGNOSIS — Z13228 Encounter for screening for other metabolic disorders: Secondary | ICD-10-CM

## 2019-11-29 DIAGNOSIS — F17200 Nicotine dependence, unspecified, uncomplicated: Secondary | ICD-10-CM

## 2019-11-29 DIAGNOSIS — Z8639 Personal history of other endocrine, nutritional and metabolic disease: Secondary | ICD-10-CM | POA: Diagnosis not present

## 2019-11-29 DIAGNOSIS — Z0001 Encounter for general adult medical examination with abnormal findings: Secondary | ICD-10-CM | POA: Diagnosis not present

## 2019-11-29 DIAGNOSIS — B351 Tinea unguium: Secondary | ICD-10-CM

## 2019-11-29 DIAGNOSIS — R0989 Other specified symptoms and signs involving the circulatory and respiratory systems: Secondary | ICD-10-CM | POA: Diagnosis not present

## 2019-11-29 DIAGNOSIS — Z130 Encounter for screening for diseases of the blood and blood-forming organs and certain disorders involving the immune mechanism: Secondary | ICD-10-CM

## 2019-11-29 DIAGNOSIS — J410 Simple chronic bronchitis: Secondary | ICD-10-CM

## 2019-11-29 DIAGNOSIS — Z114 Encounter for screening for human immunodeficiency virus [HIV]: Secondary | ICD-10-CM

## 2019-11-29 DIAGNOSIS — Z13220 Encounter for screening for lipoid disorders: Secondary | ICD-10-CM

## 2019-11-29 DIAGNOSIS — Z122 Encounter for screening for malignant neoplasm of respiratory organs: Secondary | ICD-10-CM

## 2019-11-29 DIAGNOSIS — Z1329 Encounter for screening for other suspected endocrine disorder: Secondary | ICD-10-CM

## 2019-11-29 DIAGNOSIS — Z1159 Encounter for screening for other viral diseases: Secondary | ICD-10-CM | POA: Diagnosis not present

## 2019-11-29 NOTE — Progress Notes (Signed)
Victoria Mann 58 y.o.   Chief Complaint  Patient presents with   Annual Exam    no pap smear    HISTORY OF PRESENT ILLNESS: This is a 58 y.o. female here for annual exam. Chronic smoker for over 40 years.  Chronic cough and chronic runny nose and throat inflammation. Occasional night leg cramps. Fungal infection to nails of hands and feet. History of plantar fasciitis. History of celiac sprue. History of vitamin D deficiency. Baseline blood pressure is low. No other complaints or medical concerns.  HPI   Prior to Admission medications   Medication Sig Start Date End Date Taking? Authorizing Provider  Cholecalciferol (VITAMIN D3 PO) Take by mouth.   Yes [provider]  Zinc 30 MG TABS Take by mouth.   Yes [provider]  famotidine (PEPCID) 20 MG tablet Take 1 tablet (20 mg total) by mouth 2 (two) times daily. 11/16/18   Zehr, Janett Billow D, PA-C    Allergies  Allergen Reactions   Esomeprazole Magnesium Hives   Lansoprazole Hives   Omeprazole Hives    REACTION: rash    Patient Active Problem List   Diagnosis Date Noted   Gastroesophageal reflux disease 09/30/2018   History of celiac disease 06/16/2018   Psoriasis    CELIAC SPRUE 10/30/2010    Past Medical History:  Diagnosis Date   Allergy    Celiac sprue    Psoriasis    Vitamin D deficiency    Wears dentures    upper only    Past Surgical History:  Procedure Laterality Date   KNEE ARTHROSCOPY  1985   LEFT   PELVIC LAPAROSCOPY  Jan 2012   To remove scar tissue   TUBAL LIGATION  9417   UMBILICAL HERNIA REPAIR  Jan 2010   UPPER GI ENDOSCOPY  2009   f/u celiac     Social History   Socioeconomic History   Marital status: Married    Spouse name: Not on file   Number of children: Not on file   Years of education: Not on file   Highest education level: Not on file  Occupational History   Not on file  Social Needs   Financial resource strain: Not on file     Food insecurity    Worry: Not on file    Inability: Not on file   Transportation needs    Medical: Not on file    Non-medical: Not on file  Tobacco Use   Smoking status: Current Every Day Smoker    Packs/day: 0.50    Years: 38.00    Pack years: 19.00    Types: Cigarettes   Smokeless tobacco: Never Used  Substance and Sexual Activity   Alcohol use: Yes    Alcohol/week: 2.0 standard drinks    Types: 2 Shots of liquor per week   Drug use: No   Sexual activity: Yes    Partners: Male    Birth control/protection: Surgical, Post-menopausal    Comment: BTL  Lifestyle   Physical activity    Days per week: Not on file    Minutes per session: Not on file   Stress: Not on file  Relationships   Social connections    Talks on phone: Not on file    Gets together: Not on file    Attends religious service: Not on file    Active member of club or organization: Not on file    Attends meetings of clubs or organizations: Not on file  Relationship status: Not on file   Intimate partner violence    Fear of current or ex partner: Not on file    Emotionally abused: Not on file    Physically abused: Not on file    Forced sexual activity: Not on file  Other Topics Concern   Not on file  Social History Narrative   Not on file    Family History  Problem Relation Age of Onset   Diabetes Mother    Hypertension Mother    Hyperlipidemia Mother    Cancer Father        LUNG   Colon polyps Father    Heart disease Maternal Grandmother    Colon cancer Neg Hx    Esophageal cancer Neg Hx    Stomach cancer Neg Hx    Rectal cancer Neg Hx      Review of Systems  Constitutional: Negative.  Negative for chills and fever.  HENT: Negative.  Negative for congestion and sore throat.   Eyes: Negative.  Negative for blurred vision and double vision.  Respiratory: Negative.  Negative for cough and shortness of breath.   Cardiovascular: Negative.  Negative for chest pain and  palpitations.  Gastrointestinal: Negative.  Negative for abdominal pain, diarrhea, nausea and vomiting.  Genitourinary: Negative.  Negative for dysuria and hematuria.  Musculoskeletal: Negative.  Negative for myalgias.  Skin: Negative.        Nail fungal infections  Neurological: Negative.  Negative for dizziness and headaches.  Endo/Heme/Allergies: Negative.   All other systems reviewed and are negative.  Today's Vitals   11/29/19 0834  BP: 118/68  Pulse: 74  Resp: 16  Temp: 98.3 F (36.8 C)  TempSrc: Oral  SpO2: 95%  Weight: 133 lb 9.6 oz (60.6 kg)  Height: 5' 4"  (1.626 m)   Body mass index is 22.93 kg/m.   Physical Exam Vitals signs reviewed.  Constitutional:      Appearance: Normal appearance.  HENT:     Head: Normocephalic.  Eyes:     Extraocular Movements: Extraocular movements intact.     Conjunctiva/sclera: Conjunctivae normal.     Pupils: Pupils are equal, round, and reactive to light.  Neck:     Musculoskeletal: Normal range of motion and neck supple.     Vascular: Carotid bruit (Left sided) present.  Cardiovascular:     Rate and Rhythm: Normal rate and regular rhythm.     Pulses: Normal pulses.     Heart sounds: Normal heart sounds.  Pulmonary:     Effort: Pulmonary effort is normal.     Breath sounds: Normal breath sounds.  Abdominal:     General: Bowel sounds are normal. There is no distension.     Palpations: Abdomen is soft.     Tenderness: There is no abdominal tenderness.  Musculoskeletal: Normal range of motion.     Right lower leg: No edema.     Left lower leg: No edema.     Comments: Lower extremities: Excellent peripheral pulses with excellent capillary refill.  Neurovascularly intact.  No skin ulcers or breakdowns.  Warm to touch.  No swelling or erythema.  Skin:    General: Skin is warm and dry.     Capillary Refill: Capillary refill takes less than 2 seconds.     Comments: Onychomycosis of several nails in both hands and feet.    Neurological:     General: No focal deficit present.     Mental Status: She is alert and oriented to person,  place, and time.  Psychiatric:        Mood and Affect: Mood normal.        Behavior: Behavior normal.    Dg Chest 2 View  Result Date: 11/29/2019 CLINICAL DATA:  Smoker.  Cough. EXAM: CHEST - 2 VIEW COMPARISON:  05/01/2017. FINDINGS: Mediastinum and hilar structures normal. Heart size normal. Left place density noted. This may represent atelectasis and or infiltrate. Follow-up exam suggested demonstrate resolution to exclude underlying mass lesion. IMPRESSION: Mild left base subsegmental atelectasis/infiltrate. Follow-up exam suggested to demonstrate resolution and to exclude underlying mass lesion. Electronically Signed   By: Marcello Moores  Register   On: 11/29/2019 09:43     ASSESSMENT & PLAN: Sumiko was seen today for annual exam.  Diagnoses and all orders for this visit:  Encounter for general adult medical examination with abnormal findings  Smoker -     DG Chest 2 View; Future  Bruit of left carotid artery -     US Carotid Duplex Bilateral; Future  History of vitamin D deficiency -     Vitamin D 25 (osteoporosis screening)  Need for hepatitis C screening test -     Hepatitis C antibody screen  Screening for HIV (human immunodeficiency virus) -     HIV antibody  Screening for deficiency anemia -     CBC with Differential  Screening for lipoid disorders -     Lipid panel  Screening for endocrine, metabolic and immunity disorder -     Comprehensive metabolic panel -     Hemoglobin A1c  Onychomycosis -     Hemoglobin A1c -     Ambulatory referral to Dermatology  Smokers' cough ([place])  Encounter for screening for malignant neoplasm of respiratory organs -     CT CHEST LUNG CA SCREEN LOW DOSE W/O CM; Future  Patient is a smoker with an abnormal chest x-ray.  CT of chest warranted to screen for malignancy.  Patient Instructions       If you have lab  work done today you will be contacted with your lab results within the next 2 weeks.  If you have not heard from [place] then please contact us. The fastest way to get your results is to register for My Chart.   IF you received an x-ray today, you will receive an invoice from [facility]. Please contact [facility] at [phone] with questions or concerns regarding your invoice.   IF you received labwork today, you will receive an invoice from [place]. Please contact [facility] at [phone] with questions or concerns regarding your invoice.   Our billing staff will not be able to assist you with questions regarding bills from these companies.  You will be contacted with the lab results as soon as they are available. The fastest way to get your results is to activate your My Chart account. Instructions are located on the last page of this paperwork. If you have not heard from [place] regarding the results in 2 weeks, please contact this office.      Health Maintenance, Female Adopting a healthy lifestyle and getting preventive care are important in promoting health and wellness. Ask your health care provider about:  The right schedule for you to have regular tests and exams.  Things you can do on your own to prevent diseases and keep yourself healthy. What should I know about diet, weight, and exercise? Eat a healthy diet   Eat a diet that includes plenty of vegetables, fruits, low-fat dairy  products, and lean protein.  Do not eat a lot of foods that are high in solid fats, added sugars, or sodium. Maintain a healthy weight Body mass index (BMI) is used to identify weight problems. It estimates body fat based on height and weight. Your health care provider can help determine your BMI and help you achieve or maintain a healthy weight. Get regular exercise Get regular exercise. This is one of the most important things you can do for your health. Most adults should:  Exercise  for at least 150 minutes each week. The exercise should increase your heart rate and make you sweat (moderate-intensity exercise).  Do strengthening exercises at least twice a week. This is in addition to the moderate-intensity exercise.  Spend less time sitting. Even light physical activity can be beneficial. Watch cholesterol and blood lipids Have your blood tested for lipids and cholesterol at 57 years of age, then have this test every 5 years. Have your cholesterol levels checked more often if:  Your lipid or cholesterol levels are high.  You are older than 57 years of age.  You are at high risk for heart disease. What should I know about cancer screening? Depending on your health history and family history, you may need to have cancer screening at various ages. This may include screening for:  Breast cancer.  Cervical cancer.  Colorectal cancer.  Skin cancer.  Lung cancer. What should I know about heart disease, diabetes, and high blood pressure? Blood pressure and heart disease  High blood pressure causes heart disease and increases the risk of stroke. This is more likely to develop in people who have high blood pressure readings, are of African descent, or are overweight.  Have your blood pressure checked: ? Every 3-5 years if you are 52-16 years of age. ? Every year if you are 64 years old or older. Diabetes Have regular diabetes screenings. This checks your fasting blood sugar level. Have the screening done:  Once every three years after age 74 if you are at a normal weight and have a low risk for diabetes.  More often and at a younger age if you are overweight or have a high risk for diabetes. What should I know about preventing infection? Hepatitis B If you have a higher risk for hepatitis B, you should be screened for this virus. Talk with your health care provider to find out if you are at risk for hepatitis B infection. Hepatitis C Testing is recommended  for:  Everyone born from [birth year] through 1964.  Anyone with known risk factors for hepatitis C. Sexually transmitted infections (STIs)  Get screened for STIs, including gonorrhea and chlamydia, if: ? You are sexually active and are younger than 57 years of age. ? You are older than 57 years of age and your health care provider tells you that you are at risk for this type of infection. ? Your sexual activity has changed since you were last screened, and you are at increased risk for chlamydia or gonorrhea. Ask your health care provider if you are at risk.  Ask your health care provider about whether you are at high risk for HIV. Your health care provider may recommend a prescription medicine to help prevent HIV infection. If you choose to take medicine to prevent HIV, you should first get tested for HIV. You should then be tested every 3 months for as long as you are taking the medicine. Pregnancy  If you are about to stop having your  period (premenopausal) and you may become pregnant, seek counseling before you get pregnant.  Take 400 to 800 micrograms (mcg) of folic acid every day if you become pregnant.  Ask for birth control (contraception) if you want to prevent pregnancy. Osteoporosis and menopause Osteoporosis is a disease in which the bones lose minerals and strength with aging. This can result in bone fractures. If you are 27 years old or older, or if you are at risk for osteoporosis and fractures, ask your health care provider if you should:  Be screened for bone loss.  Take a calcium or vitamin D supplement to lower your risk of fractures.  Be given hormone replacement therapy (HRT) to treat symptoms of menopause. Follow these instructions at home: Lifestyle  Do not use any products that contain nicotine or tobacco, such as cigarettes, e-cigarettes, and chewing tobacco. If you need help quitting, ask your health care provider.  Do not use street drugs.  Do not share  needles.  Ask your health care provider for help if you need support or information about quitting drugs. Alcohol use  Do not drink alcohol if: ? Your health care provider tells you not to drink. ? You are pregnant, may be pregnant, or are planning to become pregnant.  If you drink alcohol: ? Limit how much you use to 0-1 drink a day. ? Limit intake if you are breastfeeding.  Be aware of how much alcohol is in your drink. In the [place] one drink equals one 12 oz bottle of beer (355 mL), one 5 oz glass of wine (148 mL), or one 1 oz glass of hard liquor (44 mL). General instructions  Schedule regular health, dental, and eye exams.  Stay current with your vaccines.  Tell your health care provider if: ? You often feel depressed. ? You have ever been abused or do not feel safe at home. Summary  Adopting a healthy lifestyle and getting preventive care are important in promoting health and wellness.  Follow your health care provider's instructions about healthy diet, exercising, and getting tested or screened for diseases.  Follow your health care provider's instructions on monitoring your cholesterol and blood pressure. This information is not intended to replace advice given to you by your health care provider. Make sure you discuss any questions you have with your health care provider. Document Released: 06/24/2011 Document Revised: 12/02/2018 Document Reviewed: 12/02/2018 Elsevier Patient Education  2020 Elsevier Inc.      Agustina Caroli, MD [facility]

## 2019-11-29 NOTE — Patient Instructions (Addendum)
If you have lab work done today you will be contacted with your lab results within the next 2 weeks.  If you have not heard from [place] then please contact us. The fastest way to get your results is to register for My Chart.   IF you received an x-ray today, you will receive an invoice from [facility]. Please contact [facility] at [phone] with questions or concerns regarding your invoice.   IF you received labwork today, you will receive an invoice from [place]. Please contact [facility] at [phone] with questions or concerns regarding your invoice.   Our billing staff will not be able to assist you with questions regarding bills from these companies.  You will be contacted with the lab results as soon as they are available. The fastest way to get your results is to activate your My Chart account. Instructions are located on the last page of this paperwork. If you have not heard from [place] regarding the results in 2 weeks, please contact this office.      Health Maintenance, Female Adopting a healthy lifestyle and getting preventive care are important in promoting health and wellness. Ask your health care provider about:  The right schedule for you to have regular tests and exams.  Things you can do on your own to prevent diseases and keep yourself healthy. What should I know about diet, weight, and exercise? Eat a healthy diet   Eat a diet that includes plenty of vegetables, fruits, low-fat dairy products, and lean protein.  Do not eat a lot of foods that are high in solid fats, added sugars, or sodium. Maintain a healthy weight Body mass index (BMI) is used to identify weight problems. It estimates body fat based on height and weight. Your health care provider can help determine your BMI and help you achieve or maintain a healthy weight. Get regular exercise Get regular exercise. This is one of the most important things you can do for your health. Most  adults should:  Exercise for at least 150 minutes each week. The exercise should increase your heart rate and make you sweat (moderate-intensity exercise).  Do strengthening exercises at least twice a week. This is in addition to the moderate-intensity exercise.  Spend less time sitting. Even light physical activity can be beneficial. Watch cholesterol and blood lipids Have your blood tested for lipids and cholesterol at 57 years of age, then have this test every 5 years. Have your cholesterol levels checked more often if:  Your lipid or cholesterol levels are high.  You are older than 57 years of age.  You are at high risk for heart disease. What should I know about cancer screening? Depending on your health history and family history, you may need to have cancer screening at various ages. This may include screening for:  Breast cancer.  Cervical cancer.  Colorectal cancer.  Skin cancer.  Lung cancer. What should I know about heart disease, diabetes, and high blood pressure? Blood pressure and heart disease  High blood pressure causes heart disease and increases the risk of stroke. This is more likely to develop in people who have high blood pressure readings, are of African descent, or are overweight.  Have your blood pressure checked: ? Every 3-5 years if you are 50-75 years of age. ? Every year if you are 72 years old or older. Diabetes Have regular diabetes screenings. This checks your fasting blood sugar level. Have the screening done:  Once every  three years after age 32 if you are at a normal weight and have a low risk for diabetes.  More often and at a younger age if you are overweight or have a high risk for diabetes. What should I know about preventing infection? Hepatitis B If you have a higher risk for hepatitis B, you should be screened for this virus. Talk with your health care provider to find out if you are at risk for hepatitis B infection. Hepatitis  C Testing is recommended for:  Everyone born from [birth year] through 1964.  Anyone with known risk factors for hepatitis C. Sexually transmitted infections (STIs)  Get screened for STIs, including gonorrhea and chlamydia, if: ? You are sexually active and are younger than 57 years of age. ? You are older than 57 years of age and your health care provider tells you that you are at risk for this type of infection. ? Your sexual activity has changed since you were last screened, and you are at increased risk for chlamydia or gonorrhea. Ask your health care provider if you are at risk.  Ask your health care provider about whether you are at high risk for HIV. Your health care provider may recommend a prescription medicine to help prevent HIV infection. If you choose to take medicine to prevent HIV, you should first get tested for HIV. You should then be tested every 3 months for as long as you are taking the medicine. Pregnancy  If you are about to stop having your period (premenopausal) and you may become pregnant, seek counseling before you get pregnant.  Take 400 to 800 micrograms (mcg) of folic acid every day if you become pregnant.  Ask for birth control (contraception) if you want to prevent pregnancy. Osteoporosis and menopause Osteoporosis is a disease in which the bones lose minerals and strength with aging. This can result in bone fractures. If you are 57 years old or older, or if you are at risk for osteoporosis and fractures, ask your health care provider if you should:  Be screened for bone loss.  Take a calcium or vitamin D supplement to lower your risk of fractures.  Be given hormone replacement therapy (HRT) to treat symptoms of menopause. Follow these instructions at home: Lifestyle  Do not use any products that contain nicotine or tobacco, such as cigarettes, e-cigarettes, and chewing tobacco. If you need help quitting, ask your health care provider.  Do not use street  drugs.  Do not share needles.  Ask your health care provider for help if you need support or information about quitting drugs. Alcohol use  Do not drink alcohol if: ? Your health care provider tells you not to drink. ? You are pregnant, may be pregnant, or are planning to become pregnant.  If you drink alcohol: ? Limit how much you use to 0-1 drink a day. ? Limit intake if you are breastfeeding.  Be aware of how much alcohol is in your drink. In the [place] one drink equals one 12 oz bottle of beer (355 mL), one 5 oz glass of wine (148 mL), or one 1 oz glass of hard liquor (44 mL). General instructions  Schedule regular health, dental, and eye exams.  Stay current with your vaccines.  Tell your health care provider if: ? You often feel depressed. ? You have ever been abused or do not feel safe at home. Summary  Adopting a healthy lifestyle and getting preventive care are important in promoting health and wellness.  Follow your health care provider's instructions about healthy diet, exercising, and getting tested or screened for diseases.  Follow your health care provider's instructions on monitoring your cholesterol and blood pressure. This information is not intended to replace advice given to you by your health care provider. Make sure you discuss any questions you have with your health care provider. Document Released: 06/24/2011 Document Revised: 12/02/2018 Document Reviewed: 12/02/2018 Elsevier Patient Education  2020 [employer].

## 2019-11-30 ENCOUNTER — Other Ambulatory Visit: Payer: Self-pay | Admitting: Emergency Medicine

## 2019-11-30 ENCOUNTER — Encounter: Payer: Self-pay | Admitting: Emergency Medicine

## 2019-11-30 DIAGNOSIS — E559 Vitamin D deficiency, unspecified: Secondary | ICD-10-CM

## 2019-11-30 LAB — CBC WITH DIFFERENTIAL/PLATELET
Basophils Absolute: 0 10*3/uL (ref 0.0–0.2)
Basos: 0 %
EOS (ABSOLUTE): 0.1 10*3/uL (ref 0.0–0.4)
Eos: 1 %
Hematocrit: 44 % (ref 34.0–46.6)
Hemoglobin: 15 g/dL (ref 11.1–15.9)
Immature Grans (Abs): 0 10*3/uL (ref 0.0–0.1)
Immature Granulocytes: 0 %
Lymphocytes Absolute: 1.7 10*3/uL (ref 0.7–3.1)
Lymphs: 32 %
MCH: 33.9 pg — ABNORMAL HIGH (ref 26.6–33.0)
MCHC: 34.1 g/dL (ref 31.5–35.7)
MCV: 100 fL — ABNORMAL HIGH (ref 79–97)
Monocytes Absolute: 0.4 10*3/uL (ref 0.1–0.9)
Monocytes: 8 %
Neutrophils Absolute: 3 10*3/uL (ref 1.4–7.0)
Neutrophils: 59 %
Platelets: 258 10*3/uL (ref 150–450)
RBC: 4.42 x10E6/uL (ref 3.77–5.28)
RDW: 11.6 % — ABNORMAL LOW (ref 11.7–15.4)
WBC: 5.2 10*3/uL (ref 3.4–10.8)

## 2019-11-30 LAB — HIV ANTIBODY (ROUTINE TESTING W REFLEX): HIV Screen 4th Generation wRfx: NONREACTIVE

## 2019-11-30 LAB — COMPREHENSIVE METABOLIC PANEL
ALT: 14 IU/L (ref 0–32)
AST: 18 IU/L (ref 0–40)
Albumin/Globulin Ratio: 2.4 — ABNORMAL HIGH (ref 1.2–2.2)
Albumin: 4.6 g/dL (ref 3.8–4.9)
Alkaline Phosphatase: 105 IU/L (ref 39–117)
BUN/Creatinine Ratio: 17 (ref 9–23)
BUN: 11 mg/dL (ref 6–24)
Bilirubin Total: 0.5 mg/dL (ref 0.0–1.2)
CO2: 26 mmol/L (ref 20–29)
Calcium: 9.7 mg/dL (ref 8.7–10.2)
Chloride: 104 mmol/L (ref 96–106)
Creatinine, Ser: 0.63 mg/dL (ref 0.57–1.00)
GFR calc Af Amer: 115 mL/min/{1.73_m2} (ref >59)
GFR calc non Af Amer: 100 mL/min/{1.73_m2} (ref >59)
Globulin, Total: 1.9 g/dL (ref 1.5–4.5)
Glucose: 94 mg/dL (ref 65–99)
Potassium: 4 mmol/L (ref 3.5–5.2)
Sodium: 143 mmol/L (ref 134–144)
Total Protein: 6.5 g/dL (ref 6.0–8.5)

## 2019-11-30 LAB — HEMOGLOBIN A1C
Est. average glucose Bld gHb Est-mCnc: 105 mg/dL
Hgb A1c MFr Bld: 5.3 % (ref 4.8–5.6)

## 2019-11-30 LAB — LIPID PANEL
Chol/HDL Ratio: 2.7 ratio (ref 0.0–4.4)
Cholesterol, Total: 217 mg/dL — ABNORMAL HIGH (ref 100–199)
HDL: 79 mg/dL (ref >39)
LDL Chol Calc (NIH): 125 mg/dL — ABNORMAL HIGH (ref 0–99)
Triglycerides: 77 mg/dL (ref 0–149)
VLDL Cholesterol Cal: 13 mg/dL (ref 5–40)

## 2019-11-30 LAB — VITAMIN D 25 HYDROXY (VIT D DEFICIENCY, FRACTURES): Vit D, 25-Hydroxy: 27.1 ng/mL — ABNORMAL LOW (ref 30.0–100.0)

## 2019-11-30 LAB — HEPATITIS C ANTIBODY: Hep C Virus Ab: <0.1 s/co ratio (ref 0.0–0.9)

## 2019-11-30 MED ORDER — VITAMIN D (ERGOCALCIFEROL) 1.25 MG (50000 UNIT) PO CAPS: 50000.0000 [IU] | ORAL_CAPSULE | ORAL | 0 refills | Status: DC

## 2019-12-03 ENCOUNTER — Encounter: Payer: Self-pay | Admitting: Emergency Medicine

## 2019-12-07 ENCOUNTER — Other Ambulatory Visit: Payer: Self-pay | Admitting: Emergency Medicine

## 2019-12-07 DIAGNOSIS — R05 Cough: Secondary | ICD-10-CM

## 2019-12-07 DIAGNOSIS — R059 Cough, unspecified: Secondary | ICD-10-CM

## 2019-12-08 ENCOUNTER — Telehealth: Payer: Self-pay | Admitting: Emergency Medicine

## 2019-12-08 NOTE — Telephone Encounter (Signed)
Referral was sent requesting Ct lung cancer screening   Order ct chest with contrast  He is f/u with something abnormal and that should be followed up through our office is what she said   Langley Gauss [phone]  Call back for any questions

## 2019-12-15 ENCOUNTER — Ambulatory Visit (HOSPITAL_COMMUNITY)
Admission: RE | Admit: 2019-12-15 | Discharge: 2019-12-15 | Disposition: A | Discharge status: Home / Self Care | Payer: BC Managed Care – PPO | Source: Ambulatory Visit | Attending: Emergency Medicine | Admitting: Emergency Medicine

## 2019-12-15 ENCOUNTER — Other Ambulatory Visit: Payer: Self-pay

## 2019-12-15 DIAGNOSIS — R05 Cough: Secondary | ICD-10-CM | POA: Insufficient documentation

## 2019-12-15 DIAGNOSIS — R059 Cough, unspecified: Secondary | ICD-10-CM

## 2019-12-15 MED ORDER — IOHEXOL 300 MG/ML  SOLN
75.0000 mL | Freq: Once | INTRAMUSCULAR | Status: AC | PRN
  Administered 2019-12-15: 17:00:00 75 mL via INTRAVENOUS

## 2019-12-20 ENCOUNTER — Ambulatory Visit (HOSPITAL_COMMUNITY): Payer: BC Managed Care – PPO

## 2019-12-20 ENCOUNTER — Encounter (HOSPITAL_COMMUNITY): Payer: Self-pay

## 2019-12-20 ENCOUNTER — Encounter: Payer: Self-pay | Admitting: Emergency Medicine

## 2019-12-21 ENCOUNTER — Other Ambulatory Visit: Payer: Self-pay

## 2019-12-21 ENCOUNTER — Ambulatory Visit (HOSPITAL_COMMUNITY)
Admission: RE | Admit: 2019-12-21 | Discharge: 2019-12-21 | Disposition: A | Discharge status: Home / Self Care | Payer: BC Managed Care – PPO | Source: Ambulatory Visit | Attending: Emergency Medicine | Admitting: Emergency Medicine

## 2019-12-21 ENCOUNTER — Encounter: Payer: Self-pay | Admitting: Emergency Medicine

## 2019-12-21 ENCOUNTER — Other Ambulatory Visit: Payer: Self-pay | Admitting: Emergency Medicine

## 2019-12-21 DIAGNOSIS — R0989 Other specified symptoms and signs involving the circulatory and respiratory systems: Secondary | ICD-10-CM | POA: Diagnosis not present

## 2019-12-21 DIAGNOSIS — I779 Disorder of arteries and arterioles, unspecified: Secondary | ICD-10-CM

## 2019-12-31 ENCOUNTER — Encounter: Payer: Self-pay | Admitting: Emergency Medicine

## 2020-01-02 ENCOUNTER — Other Ambulatory Visit: Payer: Self-pay | Admitting: Emergency Medicine

## 2020-01-02 DIAGNOSIS — E559 Vitamin D deficiency, unspecified: Secondary | ICD-10-CM

## 2020-01-03 NOTE — Telephone Encounter (Signed)
Requested medication (s) are due for refill today: yes  Requested medication (s) are on the active medication list: yes  Last refill:  11/30/2019  Future visit scheduled: no  Notes to clinic:  50,000 IU strengths are not delegated   Requested Prescriptions  Pending Prescriptions Disp Refills   Vitamin D, Ergocalciferol, (DRISDOL) 1.25 MG (50000 UT) CAPS capsule [Pharmacy Med Name: VIT D2 (ERGOCAL) 1.25MG(50,000U) CP] 4 capsule 0    Sig: TAKE ONE CAPSULE BY MOUTH EVERY 7 DAYS FOR 7 DOSES      Endocrinology:  Vitamins - Vitamin D Supplementation Failed - 01/02/2020 11:58 AM      Failed - 50,000 IU strengths are not delegated      Failed - Phosphate in normal range and within 360 days    No results found for: PHOS        Failed - Vitamin D in normal range and within 360 days    Vit D, 25-Hydroxy  Date Value Ref Range Status  11/29/2019 27.1 (L) 30.0 - 100.0 ng/mL Final    Comment:    Vitamin D deficiency has been defined by the Institute of Medicine and an Endocrine Society practice guideline as a level of serum 25-OH vitamin D less than 20 ng/mL (1,2). The Endocrine Society went on to further define vitamin D insufficiency as a level between 21 and 29 ng/mL (2). 1. IOM (Institute of Medicine). 2010. Dietary reference    intakes for calcium and D. [place]: The    [employer]. 2. Holick MF, Binkley Groom, Bischoff-Ferrari HA, et al.    Evaluation, treatment, and prevention of vitamin D    deficiency: an Endocrine Society clinical practice    guideline. JCEM. 2011 Jul; 96(7):1911-30.           Passed - Ca in normal range and within 360 days    Calcium  Date Value Ref Range Status  11/29/2019 9.7 8.7 - 10.2 mg/dL Final          Passed - Valid encounter within last 12 months    Recent Outpatient Visits           1 month ago Encounter for general adult medical examination with abnormal findings   [facility] at [facility], Ines Bloomer, MD   1 year  ago Bilateral upper abdominal discomfort   Primary Care at [facility], Ines Bloomer, MD   2 years ago Left foot pain   Primary Care at [place], Ines Bloomer, MD   2 years ago Acute bronchitis, unspecified organism   Primary Care at [facility], Ines Bloomer, MD   4 years ago Insect bite   Primary Care at [facility], Gay Filler, MD

## 2020-01-06 ENCOUNTER — Encounter: Payer: Self-pay | Admitting: Vascular Surgery

## 2020-01-06 ENCOUNTER — Other Ambulatory Visit: Payer: Self-pay

## 2020-01-06 ENCOUNTER — Ambulatory Visit: Payer: BC Managed Care – PPO | Admitting: Vascular Surgery

## 2020-01-06 VITALS — BP 117/61 | HR 73 | Temp 97.8°F | Resp 18 | Ht 64.0 in | Wt 131.9 lb

## 2020-01-06 DIAGNOSIS — I771 Stricture of artery: Secondary | ICD-10-CM | POA: Diagnosis not present

## 2020-01-06 DIAGNOSIS — R0989 Other specified symptoms and signs involving the circulatory and respiratory systems: Secondary | ICD-10-CM | POA: Diagnosis not present

## 2020-01-06 NOTE — Progress Notes (Signed)
REASON FOR CONSULT:    Bilateral carotid disease.  The consult is requested by Dr. Rosezella Rumpf.  ASSESSMENT & PLAN:   BILATERAL CAROTID BRUITS: The patient has bilateral carotid bruits.  Her carotid duplex scan shows no evidence of carotid disease or vertebral artery disease.  I do not think routine follow-up is necessary unless she develops new neurologic symptoms.  LEFT SUBCLAVIAN ARTERY STENOSIS: Her duplex scan did show a left subclavian artery stenosis.  Currently this is asymptomatic.  I explained that this does not put her at risk for stroke.  We typically would only consider addressing this if she developed worsening dizziness or left arm symptoms.  We have discussed the importance of tobacco cessation.  I encouraged her to stay as active as possible.  I will see her back as needed.  [name], MD Office: [phone]   HPI:   Victoria Mann is a pleasant 59 y.o. female, who was found to have bilateral carotid bruits.  This prompted a carotid duplex scan that was done in December.  This showed evidence of a left subclavian stenosis.  She was sent for vascular consultation.  She is right-handed.  She denies any previous history of stroke, TIAs, expressive or receptive aphasia, or amaurosis fugax.  Her only risk factor for peripheral vascular disease is smoking.  She smokes 3 cigarettes a day and has been smoking for 38 years.  She denies any history of diabetes, hypertension, hypercholesterolemia, or family history of premature cardiovascular disease.  She occasionally gets dizziness only if she stands up rapidly.  She has no left upper extremity weakness or paresthesias.  She does have chronic paresthesias in the left small finger which she has had for 25 years and this has been worked up in the past.  Past Medical History:  Diagnosis Date  . Allergy    SEASONAL  . Asthma    AS CHILD  . Celiac sprue   . GERD (gastroesophageal reflux disease)   . Psoriasis   .  Vitamin D deficiency   . Wears dentures    upper only    Family History  Problem Relation Age of Onset  . Diabetes Mother   . Hypertension Mother   . Hyperlipidemia Mother   . Cancer Father        LUNG  . Colon polyps Father   . Healthy Sister   . Healthy Brother   . Heart disease Maternal Grandmother   . Cancer Paternal Grandfather   . Healthy Brother   . Healthy Brother   . Colon cancer Neg Hx   . Esophageal cancer Neg Hx   . Stomach cancer Neg Hx   . Rectal cancer Neg Hx     SOCIAL HISTORY: Social History   Socioeconomic History  . Marital status: Married    Spouse name: Not on file  . Number of children: Not on file  . Years of education: Not on file  . Highest education level: Not on file  Occupational History  . Not on file  Tobacco Use  . Smoking status: Current Every Day Smoker    Years: 38.00    Types: Cigarettes  . Smokeless tobacco: Never Used  . Tobacco comment: smokes 3 cigarettes per day  Substance and Sexual Activity  . Alcohol use: Yes    Alcohol/week: 5.0 standard drinks    Types: 5 Shots of liquor per week  . Drug use: No  . Sexual activity: Yes    Partners: Male  Birth control/protection: Surgical, Post-menopausal    Comment: BTL  Other Topics Concern  . Not on file  Social History Narrative  . Not on file   Social Determinants of Health   Financial Resource Strain:   . Difficulty of Paying Living Expenses: Not on file  Food Insecurity:   . Worried About Charity fundraiser in the Last Year: Not on file  . Ran Out of Food in the Last Year: Not on file  Transportation Needs:   . Lack of Transportation (Medical): Not on file  . Lack of Transportation (Non-Medical): Not on file  Physical Activity:   . Days of Exercise per Week: Not on file  . Minutes of Exercise per Session: Not on file  Stress:   . Feeling of Stress : Not on file  Social Connections:   . Frequency of Communication with Friends and Family: Not on file  .  Frequency of Social Gatherings with Friends and Family: Not on file  . Attends Religious Services: Not on file  . Active Member of Clubs or Organizations: Not on file  . Attends Archivist Meetings: Not on file  . Marital Status: Not on file  Intimate Partner Violence:   . Fear of Current or Ex-Partner: Not on file  . Emotionally Abused: Not on file  . Physically Abused: Not on file  . Sexually Abused: Not on file    Allergies  Allergen Reactions  . Esomeprazole Magnesium Hives  . Lansoprazole Hives  . Omeprazole Hives    REACTION: rash  . Nexium [Esomeprazole Magnesium] Hives    Current Outpatient Medications  Medication Sig Dispense Refill  . Vitamin D, Ergocalciferol, (DRISDOL) 1.25 MG (50000 UT) CAPS capsule TAKE ONE CAPSULE BY MOUTH EVERY 7 DAYS FOR 7 DOSES 4 capsule 0  . Zinc 30 MG TABS Take by mouth.    . Cholecalciferol (VITAMIN D3 PO) Take by mouth.    . famotidine (PEPCID) 20 MG tablet Take 1 tablet (20 mg total) by mouth 2 (two) times daily. 180 tablet 3   No current facility-administered medications for this visit.    REVIEW OF SYSTEMS:  [X]  denotes positive finding, [ ]  denotes negative finding Cardiac  Comments:  Chest pain or chest pressure:    Shortness of breath upon exertion:    Short of breath when lying flat:    Irregular heart rhythm:        Vascular    Pain in calf, thigh, or hip brought on by ambulation:    Pain in feet at night that wakes you up from your sleep:  x cramps  Blood clot in your veins:    Leg swelling:         Pulmonary    Oxygen at home:    Productive cough:     Wheezing:         Neurologic    Sudden weakness in arms or legs:     Sudden numbness in arms or legs:     Sudden onset of difficulty speaking or slurred speech:    Temporary loss of vision in one eye:     Problems with dizziness:         Gastrointestinal    Blood in stool:     Vomited blood:         Genitourinary    Burning when urinating:     Blood  in urine:        Psychiatric    Major depression:  Hematologic    Bleeding problems:    Problems with blood clotting too easily:        Skin    Rashes or ulcers:        Constitutional    Fever or chills:     PHYSICAL EXAM:   Vitals:   01/06/20 0935 01/06/20 0939  BP: (!) 115/57 117/61  Pulse: 73   Resp: 18   Temp: 97.8 F (36.6 C)   TempSrc: Temporal   SpO2: 99%   Weight: 131 lb 14.4 oz (59.8 kg)   Height: 5' 4"  (1.626 m)     GENERAL: The patient is a well-nourished female, in no acute distress. The vital signs are documented above. CARDIAC: There is a regular rate and rhythm.  VASCULAR: She has bilateral carotid bruits. She has a left supraclavicular bruit. She has palpable radial pulses bilaterally. She has palpable femoral, popliteal, dorsalis pedis, and posterior tibial pulses bilaterally. PULMONARY: There is good air exchange bilaterally without wheezing or rales. ABDOMEN: Soft and non-tender with normal pitched bowel sounds.  MUSCULOSKELETAL: There are no major deformities or cyanosis. NEUROLOGIC: No focal weakness or paresthesias are detected. SKIN: There are no ulcers or rashes noted. PSYCHIATRIC: The patient has a normal affect.  DATA:    CAROTID DUPLEX: I have reviewed the carotid duplex scan that was done on 12/21/2019.  This showed a less than 39% carotid stenosis bilaterally.  Both vertebral arteries were patent with antegrade flow.  There was some evidence of a left subclavian artery stenosis.

## 2020-03-04 ENCOUNTER — Ambulatory Visit: Payer: BC Managed Care – PPO | Attending: Internal Medicine

## 2020-03-04 DIAGNOSIS — Z23 Encounter for immunization: Secondary | ICD-10-CM

## 2020-03-04 NOTE — Progress Notes (Signed)
   Covid-19 Vaccination Clinic  Name:  Victoria Mann    MRN: [id number] DOB: 1961-04-25  03/04/2020  [name] was observed post Covid-19 immunization for 15 minutes without incident. She was provided with Vaccine Information Sheet and instruction to access the V-Safe system.   [name] was instructed to call 911 with any severe reactions post vaccine: Marland Kitchen Difficulty breathing  . Swelling of face and throat  . A fast heartbeat  . A bad rash all over body  . Dizziness and weakness   Immunizations Administered    Name Date Dose VIS Date Route   Pfizer COVID-19 Vaccine 03/04/2020  2:58 PM 0.3 mL 12/03/2019 Intramuscular   Manufacturer: [place]   Lot: [id number]   [place]: [id number]

## 2020-03-07 ENCOUNTER — Other Ambulatory Visit: Payer: Self-pay

## 2020-03-07 ENCOUNTER — Encounter: Payer: Self-pay | Admitting: Podiatry

## 2020-03-07 ENCOUNTER — Ambulatory Visit: Payer: BC Managed Care – PPO | Admitting: Podiatry

## 2020-03-07 ENCOUNTER — Ambulatory Visit (INDEPENDENT_AMBULATORY_CARE_PROVIDER_SITE_OTHER): Payer: BC Managed Care – PPO

## 2020-03-07 VITALS — BP 139/72 | HR 76 | Temp 97.9°F | Resp 16

## 2020-03-07 DIAGNOSIS — M25572 Pain in left ankle and joints of left foot: Secondary | ICD-10-CM | POA: Diagnosis not present

## 2020-03-07 DIAGNOSIS — M79672 Pain in left foot: Secondary | ICD-10-CM

## 2020-03-07 DIAGNOSIS — M722 Plantar fascial fibromatosis: Secondary | ICD-10-CM | POA: Diagnosis not present

## 2020-03-07 DIAGNOSIS — G8929 Other chronic pain: Secondary | ICD-10-CM | POA: Diagnosis not present

## 2020-03-07 NOTE — Patient Instructions (Signed)

## 2020-03-08 ENCOUNTER — Other Ambulatory Visit: Payer: Self-pay | Admitting: Podiatry

## 2020-03-08 ENCOUNTER — Telehealth: Payer: Self-pay | Admitting: *Deleted

## 2020-03-08 DIAGNOSIS — M25572 Pain in left ankle and joints of left foot: Secondary | ICD-10-CM

## 2020-03-08 NOTE — Telephone Encounter (Signed)
Orders to L. Cox, CMA for pre-cert, faxed to [facility]. 

## 2020-03-08 NOTE — Telephone Encounter (Signed)
-----   Message from Trula Slade, DPM sent at 03/07/2020  4:22 PM EDT ----- I have put in an order for an MRI of the left ankle if someone could please follow up on it to see if it needs a pre-cert or anything. Thanks.

## 2020-03-10 ENCOUNTER — Telehealth: Payer: Self-pay | Admitting: *Deleted

## 2020-03-10 DIAGNOSIS — M79672 Pain in left foot: Secondary | ICD-10-CM

## 2020-03-10 DIAGNOSIS — G8929 Other chronic pain: Secondary | ICD-10-CM

## 2020-03-10 DIAGNOSIS — M722 Plantar fascial fibromatosis: Secondary | ICD-10-CM

## 2020-03-10 LAB — RHEUMATOID FACTOR: Rheumatoid fact SerPl-aCnc: <14 IU/mL (ref <14)

## 2020-03-10 LAB — ANTI-NUCLEAR AB-TITER (ANA TITER): ANA Titer 1: 1:80 {titer} — ABNORMAL HIGH

## 2020-03-10 LAB — ANA: Anti Nuclear Antibody (ANA): POSITIVE — AB

## 2020-03-10 LAB — C-REACTIVE PROTEIN: CRP: 0.5 mg/L (ref <8.0)

## 2020-03-10 LAB — HLA-B27 ANTIGEN: HLA-B27 Antigen: NEGATIVE

## 2020-03-10 LAB — SEDIMENTATION RATE: Sed Rate: 6 mm/h (ref 0–30)

## 2020-03-10 NOTE — Telephone Encounter (Signed)
-----   Message from Trula Slade, DPM sent at 03/10/2020 12:24 PM EDT ----- Tivis Ringer- can you please put in for a rheumatology referral. Given history of psoriasis, chronic heel pain and elevated ANA I would like for her to be seen. She is aware of the results. Also, she has not heard about the MRI if someone could follow up on that. Thank you.

## 2020-03-10 NOTE — Telephone Encounter (Addendum)
Prepared required form, available clinicals and demographics to [facility] once 03/07/2020 clinicals are available. Faxed required form, clinicals and demographics to [facility].

## 2020-03-12 NOTE — Telephone Encounter (Signed)
done

## 2020-03-12 NOTE — Progress Notes (Signed)
Subjective:   Patient ID: Victoria Mann, female   DOB: 59 y.o.   MRN: [id number]   HPI 59-year-old female presents the office today for concerns of left heel pain on the bottom of the heel.  She has seen 2 other physicians for this and has been ongoing since July 2018.  She previously had injections with help temporarily and she also had orthotics.  It was a gradual onset.  No recent injury.  No radiating pain.  She is having constant pain but it hurts more in the morning.  She states that she feels the foot is throbbing and she feels that she is a marble in the bottom of her heel.  She does have a history of psoriasis but no history of psoriatic arthritis or any other systemic arthritides.  However she states that she has never been checked.  She has never had an MRI of the foot.   Review of Systems  All other systems reviewed and are negative.   Past Medical History:  Diagnosis Date  . Allergy    SEASONAL  . Asthma    AS CHILD  . Celiac sprue   . GERD (gastroesophageal reflux disease)   . Psoriasis   . Vitamin D deficiency   . Wears dentures    upper only    Past Surgical History:  Procedure Laterality Date  . HERNIA REPAIR    . KNEE ARTHROSCOPY  1985   LEFT  . PELVIC LAPAROSCOPY  Jan 2012   To remove scar tissue  . TUBAL LIGATION  1993  . UMBILICAL HERNIA REPAIR  Jan 2010  . UPPER GI ENDOSCOPY  2009   f/u celiac      Current Outpatient Medications:  .  Omega-3 Fatty Acids (FISH OIL) 1000 MG CAPS, , Disp: , Rfl:  .  VITAMIN D PO, Take 2,000 Units by mouth daily., Disp: , Rfl:  .  Zinc 30 MG TABS, Take by mouth., Disp: , Rfl:   Allergies  Allergen Reactions  . Esomeprazole Magnesium Hives  . Lansoprazole Hives  . Omeprazole Hives    REACTION: rash  . Nexium [Esomeprazole Magnesium] Hives        Objective:  Physical Exam  General: AAO x3, NAD  Dermatological: Skin is warm, dry and supple bilateral. Nails x 10 are well manicured; remaining integument  appears unremarkable at this time. There are no open sores, no preulcerative lesions, no rash or signs of infection present.  Vascular: Dorsalis Pedis artery and Posterior Tibial artery pedal pulses are 2/4 bilateral with immedate capillary fill time. Pedal hair growth present. No varicosities and no lower extremity edema present bilateral. There is no pain with calf compression, swelling, warmth, erythema.   Neruologic: Grossly intact via light touch bilateral. Vibratory intact via tuning fork bilateral. Sensation intact with Semmes Weinstein monofilament.  Negative Tinel sign.  Musculoskeletal: There is tenderness palpation on the plantar medial tubercle of the calcaneus at the insertion of plantar fascia.  There is no pain with lateral compression of calcaneus.  No pain with Achilles tendon.  No other areas of discomfort identified today.  Muscular strength 5/5 in all groups tested bilateral.  Gait: Unassisted, Nonantalgic.       Assessment:   59-year-old female with chronic left heel pain, likely plantar fasciitis    Plan:  -Treatment options discussed including all alternatives, risks, and complications -Etiology of symptoms were discussed -X-rays were obtained and reviewed with the patient.  There is no evidence of acute   fracture or stress fracture not able to appreciate any significant calcaneal spurring. -At this time she has seen 2 other physicians for this and she has had numerous conservative treatments without any significant improvement.  Given her history of psoriasis I did order blood work including ESR, CRP, ANA, rheumatoid factor, HLA-B27.  I have also ordered MRI to rule out partial tear of the plantar fascia and other pathology that may be underlying.  Or the results of this before proceeding with further treatment and she agrees.  Did discuss continuing supportive shoes, gentle stretching, icing but we held off on injections.  Matthew R Wagoner DPM       

## 2020-03-20 ENCOUNTER — Telehealth: Payer: Self-pay | Admitting: *Deleted

## 2020-03-20 NOTE — Telephone Encounter (Signed)
Called and spoke with [employer] representative Candace L and the procedure code [phone] does not require prior authorization and should be medical necessary stated by the representative and the reference number is [id number]. Lattie Haw

## 2020-03-23 ENCOUNTER — Other Ambulatory Visit: Payer: Self-pay

## 2020-03-23 ENCOUNTER — Ambulatory Visit
Admission: RE | Admit: 2020-03-23 | Discharge: 2020-03-23 | Disposition: A | Discharge status: Home / Self Care | Payer: BC Managed Care – PPO | Source: Ambulatory Visit | Attending: Podiatry | Admitting: Podiatry

## 2020-03-23 DIAGNOSIS — M79672 Pain in left foot: Secondary | ICD-10-CM

## 2020-03-28 ENCOUNTER — Ambulatory Visit: Payer: BC Managed Care – PPO | Attending: Internal Medicine

## 2020-03-28 DIAGNOSIS — Z23 Encounter for immunization: Secondary | ICD-10-CM

## 2020-03-28 NOTE — Progress Notes (Signed)
   Covid-19 Vaccination Clinic  Name:  NYASIA BAXLEY    MRN: [id number] DOB: [birth year]  03/28/2020  Ms. Heathman was observed post Covid-19 immunization for 15 minutes without incident. She was provided with Vaccine Information Sheet and instruction to access the V-Safe system.   Ms. Tippets was instructed to call 911 with any severe reactions post vaccine: Marland Kitchen Difficulty breathing  . Swelling of face and throat  . A fast heartbeat  . A bad rash all over body  . Dizziness and weakness   Immunizations Administered    Name Date Dose VIS Date Route   Pfizer COVID-19 Vaccine 03/28/2020  3:34 PM 0.3 mL 12/03/2019 Intramuscular   Manufacturer: [place]   Lot: [id number]   [place]: [id number]

## 2020-03-29 ENCOUNTER — Telehealth: Payer: Self-pay | Admitting: *Deleted

## 2020-03-29 NOTE — Telephone Encounter (Signed)
-----   Message from Trula Slade, DPM sent at 03/29/2020 10:20 AM EDT ----- Tivis Ringer- I had sent her a message through MyChart but not sure if she saw it. Can you let her know that the MRI did not show a tear but did confirm plantar fasciitis. I Mole want her to see the rheumatologist to rule out anything given her blood work and chronic heel pain. Also, please let her know that I would like to see her back and we can discuss treatment options as well.

## 2020-03-29 NOTE — Telephone Encounter (Signed)
Left message informing pt of Dr. Leigh Aurora statement that he had sent MRI results to MyChart and to call if she could not get into MyChart or needed more information.

## 2020-04-05 ENCOUNTER — Telehealth: Payer: Self-pay | Admitting: *Deleted

## 2020-04-05 NOTE — Telephone Encounter (Signed)
Faxed copy of MRI results to Dr. Trudie Reed.

## 2020-04-05 NOTE — Telephone Encounter (Signed)
-----   Message from Trula Slade, DPM sent at 04/04/2020  4:55 PM EDT ----- She has followed up with Rheumatology and she also had an MRI. I had sent her a mychart message with the result of the MRI. Can we schedule her a follow up now that the testing has been complete?   Also, can we send a copy of the MRI to Dr. Lenna Gilford? Thanks.

## 2020-04-24 ENCOUNTER — Other Ambulatory Visit: Payer: Self-pay

## 2020-04-24 ENCOUNTER — Ambulatory Visit: Payer: BC Managed Care – PPO | Admitting: Podiatry

## 2020-04-24 ENCOUNTER — Encounter: Payer: Self-pay | Admitting: Podiatry

## 2020-04-24 DIAGNOSIS — M722 Plantar fascial fibromatosis: Secondary | ICD-10-CM

## 2020-04-24 DIAGNOSIS — G8929 Other chronic pain: Secondary | ICD-10-CM

## 2020-04-24 DIAGNOSIS — M79672 Pain in left foot: Secondary | ICD-10-CM

## 2020-04-24 MED ORDER — DICLOFENAC SODIUM 1 % EX GEL: 2.0000 g | Freq: Four times a day (QID) | CUTANEOUS | 2 refills | Status: DC

## 2020-04-26 DIAGNOSIS — M722 Plantar fascial fibromatosis: Secondary | ICD-10-CM | POA: Insufficient documentation

## 2020-04-26 NOTE — Progress Notes (Signed)
Subjective: 59 year old female presents the office for proper evaluation of left heel pain, plantar fasciitis.  She says overall she was doing better.  She was taking turmeric as well as using Voltaren gel but this was causing nosebleeds that she stopped.  She was taking 3 tablets of turmeric a day which she thinks may have been too much.  Otherwise 7 some discomfort in the heel but has improved.Denies any systemic complaints such as fevers, chills, nausea, vomiting. No acute changes since last appointment, and no other complaints at this time.   Objective: AAO x3, NAD DP/PT pulses palpable bilaterally, CRT less than 3 seconds There is Offerdahl tenderness palpation on plantar medial tubercle of the calcaneus at the insertion of plantar fascia.  Plantar fascial piercing intact.  No edema, erythema.  Negative Tinel sign.  No pain with Achilles tendon or any other areas of discomfort. No open lesions or pre-ulcerative lesions.  No pain with calf compression, swelling, warmth, erythema  Assessment: Left chronic heel pain, plantar fasciitis  Plan: -All treatment options discussed with the patient including all alternatives, risks, complications.  -We discussed treatment options.  I also reviewed the MRI with her as well.  She will continue stretching, icing daily as well as using Voltaren gel.  Continue with supportive shoes which we discussed as well as inserts.  If she is not seeing improvement or plateaus will start likely EPAT treatment and that literature provided today. -Patient encouraged to call the office with any questions, concerns, change in symptoms.   Trula Slade DPM

## 2020-05-18 ENCOUNTER — Ambulatory Visit: Payer: BC Managed Care – PPO

## 2020-05-18 ENCOUNTER — Other Ambulatory Visit: Payer: Self-pay

## 2020-05-18 DIAGNOSIS — L603 Nail dystrophy: Secondary | ICD-10-CM

## 2020-05-18 DIAGNOSIS — L409 Psoriasis, unspecified: Secondary | ICD-10-CM

## 2020-05-18 MED ORDER — BETAMETHASONE DIPROPIONATE 0.05 % EX OINT: TOPICAL_OINTMENT | Freq: Two times a day (BID) | CUTANEOUS | 3 refills | Status: DC

## 2020-05-18 NOTE — Patient Instructions (Signed)
Good to see you today!  Thanks for coming in.  We will send your nail sample for culture.  It should return in several weeks.  We will notify you of the results.  If you do not hear from [place] in one month please call  Use the steroid ointment just on the nail twice a day.  You should see results in a few months.  Take care not to get this ointment on your skin

## 2020-05-18 NOTE — Progress Notes (Unsigned)
    SUBJECTIVE:   CHIEF COMPLAINT / HPI:   Psoriasis/fungus or finger and toe nails: 30 year history of psoriasis, however in the last 4-5 years she has noted splitting and pitting of her nails. This started on her finger nails first and later on her toes. It is mostly affecting her thumb nails and big toe nails. Her toes nails also have some milk hyperkeratosis. She has not tried anything for her nails and uses coconut cream on her psoriatic skin lesions to keep them from drying up. She is unsure if she her nails are a result of the psoriasis alone or if there is also a nail fungus component.  PERTINENT  PMH / PSH: psoriasis, celiac's disease  OBJECTIVE:   BP 124/75   Pulse 74   Ht 5' 4"  (1.626 m)   Wt 126 lb 3.2 oz (57.2 kg)   LMP 09/24/2013   SpO2 98%   BMI 21.66 kg/m   Physical Exam  Skin:  Small erythematous patches with overlying white scale on her Right shin. Pitting and splitting of her thumb and pointer finger nails bilaterally, and third toe nail on R foot. Pitting, splitting, and hyperkeratosis of her big toe nails bilaterally.     ASSESSMENT/PLAN:   Psoriasis of nail Given her history of psoriasis and the appearance of her nails, this is most consistent with psoriasis of the nails. We are doing a nail culture of the R big toe nail to determine if there is a fungus present. Treating for psoriasis for now with betamethasone dipropionate 0.05% ointment to be applied to nails 2 times daily.     [place]

## 2020-05-18 NOTE — Assessment & Plan Note (Addendum)
Given her history of psoriasis and the appearance of her nails, this is most consistent with psoriasis of the nails. We are doing a nail culture of the R big toe nail to determine if there is a fungus present. Treating for psoriasis for now with betamethasone dipropionate 0.05% ointment to be applied to nails 2 times daily.

## 2020-06-05 ENCOUNTER — Ambulatory Visit: Payer: BC Managed Care – PPO | Admitting: Podiatry

## 2020-06-09 LAB — FUNGUS CULTURE W SMEAR

## 2020-06-29 ENCOUNTER — Telehealth: Payer: Self-pay | Admitting: *Deleted

## 2020-06-29 NOTE — Telephone Encounter (Signed)
Called and left a message for the patient to see how she was doing and also to follow up on the voltaren gel and to see if patient received it or even wanted to get it and I stated that if patient did they can go the the local pharmacy and buy it over the counter. Lattie Haw

## 2020-06-29 NOTE — Telephone Encounter (Signed)
Pt states she is using voltaren gel OTC.

## 2020-12-14 ENCOUNTER — Other Ambulatory Visit: Payer: Self-pay

## 2020-12-14 ENCOUNTER — Ambulatory Visit: Payer: BC Managed Care – PPO | Admitting: *Deleted

## 2020-12-14 ENCOUNTER — Ambulatory Visit: Payer: BC Managed Care – PPO

## 2020-12-14 DIAGNOSIS — Z23 Encounter for immunization: Secondary | ICD-10-CM

## 2021-01-31 ENCOUNTER — Ambulatory Visit (INDEPENDENT_AMBULATORY_CARE_PROVIDER_SITE_OTHER): Payer: BC Managed Care – PPO | Admitting: Nurse Practitioner

## 2021-01-31 ENCOUNTER — Encounter: Payer: Self-pay | Admitting: Nurse Practitioner

## 2021-01-31 ENCOUNTER — Other Ambulatory Visit: Payer: Self-pay

## 2021-01-31 VITALS — BP 134/84 | HR 80 | Resp 18 | Ht 63.58 in | Wt 127.2 lb

## 2021-01-31 DIAGNOSIS — Z01419 Encounter for gynecological examination (general) (routine) without abnormal findings: Secondary | ICD-10-CM

## 2021-01-31 DIAGNOSIS — N951 Menopausal and female climacteric states: Secondary | ICD-10-CM | POA: Diagnosis not present

## 2021-01-31 DIAGNOSIS — Z8639 Personal history of other endocrine, nutritional and metabolic disease: Secondary | ICD-10-CM | POA: Diagnosis not present

## 2021-01-31 NOTE — Patient Instructions (Signed)

## 2021-01-31 NOTE — Progress Notes (Signed)
   Victoria Mann 1961-04-10 [id number]   History:  60 y.o. [id number] presents for annual exam. Postmenopausal - no HRT, no bleeding. She complains of some vaginal dryness with intercourse. Cryo at age 54, subsequent paps normal. Normal mammogram history. Current smoker, 1/2 ppd.   Gynecologic History Patient's last menstrual period was 09/24/2013.   Contraception/Family planning: post menopausal status and tubal ligation  Health Maintenance Last Pap: 11/24/2018. Results were: ASCUS negative HPV Last mammogram: 10/2018. Results were: normal Last colonoscopy: 2017. Results were: polyps, 5-year recall Last Dexa: Never  Past medical history, past surgical history, family history and social history were all reviewed and documented in the EPIC chart.  ROS:  A ROS was performed and pertinent positives and negatives are included.  Exam:  Vitals:   01/31/21 0811  BP: 134/84  Pulse: 80  Resp: 18  Weight: 127 lb 3.2 oz (57.7 kg)  Height: 5' 3.58" (1.615 m)   Body mass index is 22.12 kg/m.  General appearance:  Normal Thyroid:  Symmetrical, normal in size, without palpable masses or nodularity. Respiratory  Auscultation:  Clear without wheezing or rhonchi Cardiovascular  Auscultation:  Regular rate, without rubs, murmurs or gallops  Edema/varicosities:  Not grossly evident Abdominal  Soft,nontender, without masses, guarding or rebound.  Liver/spleen:  No organomegaly noted  Hernia:  None appreciated  Skin  Inspection:  Grossly normal   Breasts: Examined lying and sitting.   Right: Without masses, retractions, discharge or axillary adenopathy.   Left: Without masses, retractions, discharge or axillary adenopathy. Gentitourinary   Inguinal/mons:  Normal without inguinal adenopathy  External genitalia:  Normal  BUS/Urethra/Skene's glands:  Normal  Vagina:  Atrophic changes  Cervix:  Normal  Uterus:  Normal in size, shape and contour.  Midline and mobile  Adnexa/parametria:      Rt: Without masses or tenderness.   Lt: Without masses or tenderness.  Anus and perineum: Normal  Digital rectal exam: Normal sphincter tone without palpated masses or tenderness  Assessment/Plan:  60 y.o. [id number] for annual exam.   Well female exam with routine gynecological exam - Plan: CBC with Differential/Platelet, Comprehensive metabolic panel, Lipid panel. Education provided on SBEs, importance of preventative screenings, current guidelines, high calcium diet, regular exercise, and multivitamin daily.  History of vitamin D deficiency - Plan: VITAMIN D 25 Hydroxy (Vit-D Deficiency, Fractures)  Menopausal vaginal dryness - using OTC lubricant with some relief. Recommend an oil-based lubricant or coconut oil.   Screening for cervical cancer - Normal Pap history. 11/2018 pap showed ASCUS negative HPV. Will repeat at 3-year interval per guidelines.  Screening for breast cancer - Normal mammogram history.  Overdue for mammogram. Plans to schedule this soon. Normal breast exam today.  Screening for colon cancer - 2017 colonoscopy. Will repeat at GI's recommended interval.   Follow-up in 1 year for annual.   Tamela Gammon [facility], 8:24 AM 01/31/2021

## 2021-02-01 LAB — CBC WITH DIFFERENTIAL/PLATELET
Absolute Monocytes: 340 cells/uL (ref 200–950)
Basophils Absolute: 41 cells/uL (ref 0–200)
Basophils Relative: 0.6 %
Eosinophils Absolute: 61 cells/uL (ref 15–500)
Eosinophils Relative: 0.9 %
HCT: 44.4 % (ref 35.0–45.0)
Hemoglobin: 15.1 g/dL (ref 11.7–15.5)
Lymphs Abs: 1550 cells/uL (ref 850–3900)
MCH: 33 pg (ref 27.0–33.0)
MCHC: 34 g/dL (ref 32.0–36.0)
MCV: 97.2 fL (ref 80.0–100.0)
MPV: 10.4 fL (ref 7.5–12.5)
Monocytes Relative: 5 %
Neutro Abs: 4808 cells/uL (ref 1500–7800)
Neutrophils Relative %: 70.7 %
Platelets: 346 10*3/uL (ref 140–400)
RBC: 4.57 10*6/uL (ref 3.80–5.10)
RDW: 12 % (ref 11.0–15.0)
Total Lymphocyte: 22.8 %
WBC: 6.8 10*3/uL (ref 3.8–10.8)

## 2021-02-01 LAB — LIPID PANEL
Cholesterol: 230 mg/dL — ABNORMAL HIGH (ref <200)
HDL: 78 mg/dL (ref >=50)
LDL Cholesterol (Calc): 130 mg/dL (calc) — ABNORMAL HIGH
Non-HDL Cholesterol (Calc): 152 mg/dL (calc) — ABNORMAL HIGH (ref <130)
Total CHOL/HDL Ratio: 2.9 (calc) (ref <5.0)
Triglycerides: 108 mg/dL (ref <150)

## 2021-02-01 LAB — COMPREHENSIVE METABOLIC PANEL
AG Ratio: 2.2 (calc) (ref 1.0–2.5)
ALT: 13 U/L (ref 6–29)
AST: 17 U/L (ref 10–35)
Albumin: 4.7 g/dL (ref 3.6–5.1)
Alkaline phosphatase (APISO): 96 U/L (ref 37–153)
BUN: 9 mg/dL (ref 7–25)
CO2: 30 mmol/L (ref 20–32)
Calcium: 10 mg/dL (ref 8.6–10.4)
Chloride: 102 mmol/L (ref 98–110)
Creat: 0.64 mg/dL (ref 0.50–1.05)
Globulin: 2.1 g/dL (calc) (ref 1.9–3.7)
Glucose, Bld: 95 mg/dL (ref 65–99)
Potassium: 5 mmol/L (ref 3.5–5.3)
Sodium: 140 mmol/L (ref 135–146)
Total Bilirubin: 0.5 mg/dL (ref 0.2–1.2)
Total Protein: 6.8 g/dL (ref 6.1–8.1)

## 2021-02-01 LAB — VITAMIN D 25 HYDROXY (VIT D DEFICIENCY, FRACTURES): Vit D, 25-Hydroxy: 34 ng/mL (ref 30–100)

## 2021-02-07 ENCOUNTER — Telehealth (INDEPENDENT_AMBULATORY_CARE_PROVIDER_SITE_OTHER): Payer: BC Managed Care – PPO | Admitting: Emergency Medicine

## 2021-02-07 ENCOUNTER — Other Ambulatory Visit: Payer: Self-pay

## 2021-02-07 ENCOUNTER — Encounter: Payer: Self-pay | Admitting: Emergency Medicine

## 2021-02-07 VITALS — Temp 98.5°F | Ht 64.0 in | Wt 125.0 lb

## 2021-02-07 DIAGNOSIS — B349 Viral infection, unspecified: Secondary | ICD-10-CM | POA: Diagnosis not present

## 2021-02-07 DIAGNOSIS — R040 Epistaxis: Secondary | ICD-10-CM

## 2021-02-07 DIAGNOSIS — G4452 New daily persistent headache (NDPH): Secondary | ICD-10-CM

## 2021-02-07 DIAGNOSIS — Z8719 Personal history of other diseases of the digestive system: Secondary | ICD-10-CM | POA: Diagnosis not present

## 2021-02-07 DIAGNOSIS — F17200 Nicotine dependence, unspecified, uncomplicated: Secondary | ICD-10-CM

## 2021-02-07 DIAGNOSIS — R197 Diarrhea, unspecified: Secondary | ICD-10-CM | POA: Diagnosis not present

## 2021-02-07 NOTE — Progress Notes (Signed)
Telemedicine Encounter- SOAP NOTE Established Patient Patient: Home  Provider: Office     This telephone encounter was conducted with the patient's (or proxy's) verbal consent via audio telecommunications: yes/no: Yes Patient was instructed to have this encounter in a suitably private space; and to only have persons present to whom they give permission to participate. In addition, patient identity was confirmed by use of name plus two identifiers (DOB and address).  I discussed the limitations, risks, security and privacy concerns of performing an evaluation and management service by telephone and the availability of in person appointments. I also discussed with the patient that there may be a patient responsible charge related to this service. The patient expressed understanding and agreed to proceed.  I spent a total of TIME; 0 MIN TO 60 MIN: 20 minutes talking with the patient or their proxy.  Chief Complaint  Patient presents with  . Fatigue    Fatigue for the last 2-3 days and headache for 1 week. Patient have not been covid test.  . Epistaxis    X 2 weeks. It has been bright red and dripping    Subjective   Victoria Mann is a 60 y.o. female established patient. Telephone visit today complaining of random episodes of nosebleeds for the past 2 weeks, 4-5 times per week, along with intermittent headaches followed by watery profuse nonbloody diarrhea for the past couple days.  Denies fever or chills.  Able to eat and drink.  Denies nausea or vomiting.  Denies abdominal pain.  Fully vaccinated against COVID with a booster.  Denies flulike symptoms or difficulty breathing.  Recent blood work done on 12/31/2020 include a CBC, CMP, lipid profile, and vitamin D level.  No significant abnormality other than elevated cholesterol.  No other associated symptoms. No other complaints or medical concerns today.  HPI   Patient Active Problem List   Diagnosis Date Noted  . Nail dystrophy  05/18/2020  . Gastroesophageal reflux disease 09/30/2018  . History of celiac disease 06/16/2018  . Psoriasis of nail   . 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 Allergy    SEASONAL  . Asthma    AS CHILD  . Celiac sprue   . GERD (gastroesophageal reflux disease)   . Psoriasis   . Vitamin D deficiency   . Wears dentures    upper only    Current Outpatient Medications  Medication Sig Dispense Refill  . betamethasone dipropionate (DIPROLENE) 0.05 % ointment Apply topically 2 (two) times daily. To affected nails 30 g 3  . VITAMIN D PO Take 2,000 Units by mouth daily.    . Zinc 30 MG TABS Take by mouth.     No current facility-administered medications for this visit.    Allergies  Allergen Reactions  . Esomeprazole Magnesium Hives  . Lansoprazole Hives  . Omeprazole Hives    REACTION: rash  . Nexium [Esomeprazole Magnesium] Hives    Social History   Socioeconomic History  . Marital status: Married    Spouse name: Not on file  . Number of children: Not on file  . Years of education: Not on file  . Highest education level: Not on file  Occupational History  . Not on file  Tobacco Use  . Smoking status: Current Every Day Smoker    Years: 38.00    Types: Cigarettes  . Smokeless tobacco: Never Used  . Tobacco comment: smokes 1/2 pack cigarettes per day  Substance and Sexual Activity  . Alcohol use: Yes    Alcohol/week: 5.0 standard drinks    Types: 5 Shots of liquor per week  . Drug use: No  . Sexual activity: Yes    Partners: Male    Birth control/protection: Surgical, Post-menopausal    Comment: BTL  Other Topics Concern  . Not on file  Social History Narrative  . Not on file   Social Determinants of Health   Financial Resource Strain: Not on file  Food Insecurity: Not on file  Transportation Needs: Not on file  Physical Activity: Not on file  Stress: Not on file  Social Connections: Not on file  Intimate Partner  Violence: Not on file    Review of Systems  Constitutional: Negative.  Negative for chills and fever.  HENT: Positive for nosebleeds. Negative for congestion.   Respiratory: Negative.  Negative for cough and shortness of breath.   Cardiovascular: Negative.  Negative for chest pain and palpitations.  Gastrointestinal: Negative for abdominal pain, diarrhea, nausea and vomiting.  Genitourinary: Negative.  Negative for dysuria and hematuria.  Musculoskeletal: Negative.  Negative for back pain, myalgias and neck pain.  Skin: Negative.  Negative for rash.  Neurological: Negative.  Negative for dizziness and headaches.  All other systems reviewed and are negative.   Objective  Alert and oriented x3 in no apparent respiratory distress. Vitals as reported by the patient: Today's Vitals   02/07/21 1431  Temp: 98.5 F (36.9 C)  TempSrc: Temporal  Weight: 125 lb (56.7 kg)  Height: 5' 4"  (1.626 m)    There are no diagnoses linked to this encounter.  Victoria Mann was seen today for fatigue and epistaxis.  Diagnoses and all orders for this visit:  Viral illness  Diarrhea of presumed infectious origin  History of celiac disease  Current smoker  Frequent epistaxis  New daily persistent headache    Clinically stable.  No red flag signs or symptoms.  Most likely viral illness. Anticipatory guidance.  Symptomatic treatment. Advised to contact the office if no better or worse during the next several days.  I discussed the assessment and treatment plan with the patient. The patient was provided an opportunity to ask questions and all were answered. The patient agreed with the plan and demonstrated an understanding of the instructions.   The patient was advised to call back or seek an in-person evaluation if the symptoms worsen or if the condition fails to improve as anticipated.  I provided 20 minutes of non-face-to-face time during this encounter.  Horald Pollen, MD  Primary  Care at [facility]

## 2021-02-07 NOTE — Patient Instructions (Signed)
° ° ° °  If you have lab work done today you will be contacted with your lab results within the next 2 weeks.  If you have not heard from us then please contact us. The fastest way to get your results is to register for My Chart. ° ° °IF you received an x-ray today, you will receive an invoice from [facility]. Please contact [facility] at [phone] with questions or concerns regarding your invoice.  ° °IF you received labwork today, you will receive an invoice from [facility]. Please contact [facility] at [phone] with questions or concerns regarding your invoice.  ° °Our billing staff will not be able to assist you with questions regarding bills from these companies. ° °You will be contacted with the lab results as soon as they are available. The fastest way to get your results is to activate your My Chart account. Instructions are located on the last page of this paperwork. If you have not heard from us regarding the results in 2 weeks, please contact this office. °  ° ° ° °

## 2021-02-08 NOTE — Telephone Encounter (Signed)
Pt is questioning if deviated septum could be cause for nose bleeds, and states she forgot to mention this during call with you

## 2021-02-08 NOTE — Telephone Encounter (Signed)
Please place ENT referral for recurrent nosebleeds.  Deviated septum not related to nosebleeds.  Thanks.

## 2021-02-27 ENCOUNTER — Encounter (INDEPENDENT_AMBULATORY_CARE_PROVIDER_SITE_OTHER): Payer: Self-pay | Admitting: Otolaryngology

## 2021-02-27 ENCOUNTER — Ambulatory Visit (INDEPENDENT_AMBULATORY_CARE_PROVIDER_SITE_OTHER): Payer: BC Managed Care – PPO | Admitting: Otolaryngology

## 2021-02-27 ENCOUNTER — Other Ambulatory Visit: Payer: Self-pay

## 2021-02-27 VITALS — Temp 97.2°F

## 2021-02-27 DIAGNOSIS — R040 Epistaxis: Secondary | ICD-10-CM

## 2021-02-27 NOTE — Progress Notes (Signed)
HPI: Victoria Mann is a 60 y.o. female who presents is referred by her PCP for evaluation of recurrent left-sided epistaxis that she has had for over a month now.  She knows she has a deviated septum.  She has been having intermittent left-sided nosebleeds every couple of days for well over a month..  Past Medical History:  Diagnosis Date  . Allergy    SEASONAL  . Asthma    AS CHILD  . Celiac sprue   . GERD (gastroesophageal reflux disease)   . Psoriasis   . Vitamin D deficiency   . Wears dentures    upper only   Past Surgical History:  Procedure Laterality Date  . HERNIA REPAIR    . KNEE ARTHROSCOPY  1985   LEFT  . PELVIC LAPAROSCOPY  Jan 2012   To remove scar tissue  . TUBAL LIGATION  1993  . UMBILICAL HERNIA REPAIR  Jan 2010  . UPPER GI ENDOSCOPY  2009   f/u celiac    Social History   Socioeconomic History  . Marital status: Married    Spouse name: Not on file  . Number of children: Not on file  . Years of education: Not on file  . Highest education level: Not on file  Occupational History  . Not on file  Tobacco Use  . Smoking status: Current Every Day Smoker    Packs/day: 0.75    Years: 42.00    Pack years: 31.50    Types: Cigarettes    Start date: 36  . Smokeless tobacco: Never Used  . Tobacco comment: smokes 1/2 pack cigarettes per day  Substance and Sexual Activity  . Alcohol use: Yes    Alcohol/week: 5.0 standard drinks    Types: 5 Shots of liquor per week  . Drug use: No  . Sexual activity: Yes    Partners: Male    Birth control/protection: Surgical, Post-menopausal    Comment: BTL  Other Topics Concern  . Not on file  Social History Narrative  . Not on file   Social Determinants of Health   Financial Resource Strain: Not on file  Food Insecurity: Not on file  Transportation Needs: Not on file  Physical Activity: Not on file  Stress: Not on file  Social Connections: Not on file   Family History  Problem Relation Age of Onset  .  Diabetes Mother   . Hypertension Mother   . Hyperlipidemia Mother   . Cancer Father        LUNG  . Colon polyps Father   . Healthy Sister   . Healthy Brother   . Heart disease Maternal Grandmother   . Cancer Paternal Grandfather   . Healthy Brother   . Healthy Brother   . Colon cancer Neg Hx   . Esophageal cancer Neg Hx   . Stomach cancer Neg Hx   . Rectal cancer Neg Hx    Allergies  Allergen Reactions  . Esomeprazole Magnesium Hives  . Lansoprazole Hives  . Omeprazole Hives    REACTION: rash  . Nexium [Esomeprazole Magnesium] Hives   Prior to Admission medications   Medication Sig Start Date End Date Taking? Authorizing Provider  betamethasone dipropionate (DIPROLENE) 0.05 % ointment Apply topically 2 (two) times daily. To affected nails 05/18/20   Lind Covert, MD  VITAMIN D PO Take 2,000 Units by mouth daily.    [provider]  Zinc 30 MG TABS Take by mouth.    [provider]  Positive ROS: Otherwise negative  All other systems have been reviewed and were otherwise negative with the exception of those mentioned in the HPI and as above.  Physical Exam: Constitutional: Alert, well-appearing, no acute distress Ears: External ears without lesions or tenderness. Ear canals are clear bilaterally with intact, clear TMs.  Nasal: External nose without lesions. Septum is deviated to the left with a concavity on the right side.  Right nasal passageways clear..  Left side the septum reveals several prominent vessels.  Identified 1 vessel that appears to have been bleeding recently left anterior inferiorly.  It did not bleed today but I suspect this is the etiology of her nosebleeds and this was cauterized using silver nitrate. Oral: Lips and gums without lesions. Tongue and palate mucosa without lesions. Posterior oropharynx clear. Neck: No palpable adenopathy or masses Respiratory: Breathing comfortably  Skin: No facial/neck lesions or rash  noted.  Control of epistaxis  Date/Time: 02/27/2021 6:00 PM Performed by: Rozetta Nunnery, MD Authorized by: Rozetta Nunnery, MD   Consent:    Consent obtained:  Verbal   Consent given by:  Patient   Risks discussed:  Bleeding and pain   Alternatives discussed:  No treatment and observation Anesthesia:    Anesthesia method:  None Procedure details:    Treatment site:  L anterior   Treatment method:  Silver nitrate   Treatment complexity:  Limited   Treatment episode: initial   Post-procedure details:    Patient tolerance of procedure:  Tolerated well, no immediate complications Comments:     Left anterior inferior septal vessel was cauterized using silver nitrate.  Remaining nasal cavity was clear.    Assessment: Left-sided nosebleed from left anterior inferior septal vessel.  Plan: This was cauterized using silver nitrate. Reviewed with her concerning using cottonball and Afrin or cold water on the cottonball as needed any further nosebleeds.  She will follow-up as needed any further epistaxis.   Radene Journey, MD   CC:

## 2021-03-01 ENCOUNTER — Other Ambulatory Visit: Payer: Self-pay | Admitting: Nurse Practitioner

## 2021-03-01 DIAGNOSIS — Z1231 Encounter for screening mammogram for malignant neoplasm of breast: Secondary | ICD-10-CM

## 2021-04-10 ENCOUNTER — Encounter: Payer: Self-pay | Admitting: Nurse Practitioner

## 2021-04-10 ENCOUNTER — Other Ambulatory Visit (INDEPENDENT_AMBULATORY_CARE_PROVIDER_SITE_OTHER): Payer: BC Managed Care – PPO

## 2021-04-10 ENCOUNTER — Ambulatory Visit (INDEPENDENT_AMBULATORY_CARE_PROVIDER_SITE_OTHER): Payer: BC Managed Care – PPO | Admitting: Nurse Practitioner

## 2021-04-10 ENCOUNTER — Other Ambulatory Visit: Payer: Self-pay

## 2021-04-10 VITALS — BP 119/70 | HR 75 | Ht 64.0 in | Wt 128.0 lb

## 2021-04-10 DIAGNOSIS — R197 Diarrhea, unspecified: Secondary | ICD-10-CM

## 2021-04-10 DIAGNOSIS — Z86010 Personal history of colonic polyps: Secondary | ICD-10-CM

## 2021-04-10 DIAGNOSIS — K219 Gastro-esophageal reflux disease without esophagitis: Secondary | ICD-10-CM

## 2021-04-10 LAB — CBC WITH DIFFERENTIAL/PLATELET
Basophils Absolute: 0 10*3/uL (ref 0.0–0.1)
Basophils Relative: 0.5 % (ref 0.0–3.0)
Eosinophils Absolute: 0 10*3/uL (ref 0.0–0.7)
Eosinophils Relative: 0.4 % (ref 0.0–5.0)
HCT: 42.1 % (ref 36.0–46.0)
Hemoglobin: 14.3 g/dL (ref 12.0–15.0)
Lymphocytes Relative: 25.8 % (ref 12.0–46.0)
Lymphs Abs: 1.5 10*3/uL (ref 0.7–4.0)
MCHC: 34 g/dL (ref 30.0–36.0)
MCV: 98.1 fl (ref 78.0–100.0)
Monocytes Absolute: 0.3 10*3/uL (ref 0.1–1.0)
Monocytes Relative: 6 % (ref 3.0–12.0)
Neutro Abs: 3.9 10*3/uL (ref 1.4–7.7)
Neutrophils Relative %: 67.3 % (ref 43.0–77.0)
Platelets: 242 10*3/uL (ref 150.0–400.0)
RBC: 4.3 Mil/uL (ref 3.87–5.11)
RDW: 12.3 % (ref 11.5–15.5)
WBC: 5.8 10*3/uL (ref 4.0–10.5)

## 2021-04-10 LAB — COMPREHENSIVE METABOLIC PANEL
ALT: 14 U/L (ref 0–35)
AST: 15 U/L (ref 0–37)
Albumin: 4 g/dL (ref 3.5–5.2)
Alkaline Phosphatase: 78 U/L (ref 39–117)
BUN: 12 mg/dL (ref 6–23)
CO2: 30 mEq/L (ref 19–32)
Calcium: 9.2 mg/dL (ref 8.4–10.5)
Chloride: 105 mEq/L (ref 96–112)
Creatinine, Ser: 0.64 mg/dL (ref 0.40–1.20)
GFR: 96.88 mL/min (ref >60.00)
Glucose, Bld: 96 mg/dL (ref 70–99)
Potassium: 4.1 mEq/L (ref 3.5–5.1)
Sodium: 141 mEq/L (ref 135–145)
Total Bilirubin: 0.6 mg/dL (ref 0.2–1.2)
Total Protein: 6.4 g/dL (ref 6.0–8.3)

## 2021-04-10 LAB — TSH: TSH: 0.77 u[IU]/mL (ref 0.35–4.50)

## 2021-04-10 LAB — C-REACTIVE PROTEIN: CRP: <1 mg/dL (ref 0.5–20.0)

## 2021-04-10 NOTE — Progress Notes (Signed)
04/10/2021 Victoria Mann [id number] [birth year]   Chief Complaint: Diarrhea   History of Present Illness: Victoria Mann is a 60 year old female with a past medical history of epistaxis, psoriasis, GERD and celiac disease. She is followed by Dr. Ardis Hughs.   She reports being diagnosed with celiac disease as an infant based on her GI symptoms in the early to mid 1960's. She stated undergoing an EGD in [place] in the 1990's which included duodenal biopsies which were diagnostic for celiac disease. She reports being on a strict 100% gluten free diet for at least the past 5 years.   She presents to our office today for further evaluation for diarrhea which started 2 months ago. The first day of diarrhea started after she drank several alcoholic beverages the previous evening. She initially passed 8 to 10 episodes of nonbloody brown watery to mud like diarrhea stools. Since then, she has 1 to 3 loose BMs daily. Sometimes she sees undigested food in her diarrhea. She infrequently uses Imodium. No associated abdominal pain but she notices a lot of abdominal "gurgling" noise. No weight loss. No specific food triggers. She is eating a fairly bland gluten free diet. In the past, if she ate gluten products such as a bagel she developed explosive diarrhea, headaches and joint pain. No recent antibiotics. No NSAID use. She underwent an EGD 07/06/2018 by Dr. Lyndel Safe which showed duodenal biopsies normal, no evidence of celiac disease. Distal esophageal biopsies with ulceration c/w severe reflux, no Barrett's.  Gastric biopsies with reactive changes and focal intestinal metaplasia.  Due to this last finding it was recommended that she have a repeat EGD in 3 years.  She was treated with Sucralfate and Zantac 156m on po bid as she reported being allergic to PPIs. She has rare heartburn. No dysphagia. No longer taking H2 blocker. She underwent a colonoscopy by Dr. JArdis Hughs11/15/2017 which identified 2 tubular adenomatous  polyps which were removed from the ascending colon. She was advised to repeat a colonoscopy in 5 years. Father with history of colon polyps and lung cancer.   CBC Latest Ref Rng & Units 01/31/2021 11/29/2019 06/16/2018  WBC 3.8 - 10.8 Thousand/uL 6.8 5.2 6.8  Hemoglobin 11.7 - 15.5 g/dL 15.1 15.0 14.4  Hematocrit 35.0 - 45.0 % 44.4 44.0 43.0  Platelets 140 - 400 Thousand/uL 346 258 294    CMP Latest Ref Rng & Units 01/31/2021 11/29/2019 06/16/2018  Glucose 65 - 99 mg/dL 95 94 92  BUN 7 - 25 mg/dL 9 11 10   Creatinine 0.50 - 1.05 mg/dL 0.64 0.63 0.62  Sodium 135 - 146 mmol/L 140 143 140  Potassium 3.5 - 5.3 mmol/L 5.0 4.0 3.9  Chloride 98 - 110 mmol/L 102 104 101  CO2 20 - 32 mmol/L 30 26 23   Calcium 8.6 - 10.4 mg/dL 10.0 9.7 10.1  Total Protein 6.1 - 8.1 g/dL 6.8 6.5 6.5  Total Bilirubin 0.2 - 1.2 mg/dL 0.5 0.5 0.3  Alkaline Phos 39 - 117 IU/L - 105 77  AST 10 - 35 U/L 17 18 19   ALT 6 - 29 U/L 13 14 16    Past Medical History:  Diagnosis Date  . Allergy    SEASONAL  . Asthma    AS CHILD  . Celiac sprue   . GERD (gastroesophageal reflux disease)   . Psoriasis   . Vitamin D deficiency   . Wears dentures    upper only   Past Surgical History:  Procedure  Laterality Date  . HERNIA REPAIR    . KNEE ARTHROSCOPY  1985   LEFT  . PELVIC LAPAROSCOPY  Jan 2012   To remove scar tissue  . TUBAL LIGATION  1993  . UMBILICAL HERNIA REPAIR  Jan 2010  . UPPER GI ENDOSCOPY  2009   f/u celiac     EGD 07/06/2018: - Localized mild inflammation characterized by erythema was found in the gastric antrum. Biopsies were taken with a cold forceps for histology. - The in the duodenum was normal. Biopsies for histology were taken with a cold forceps for evaluation of celiac disease. LA Grade B reflux esophagitis with small hiatal hernia.  - Mild gastritis.  LA grade B esophagitis with small hiatal hernia and gastritis.   - Repeat EGD in 3 yeas due to intestinal metaplasia.  Biopsy Result: 1.  Surgical [P], duodenum - DUODENAL MUCOSA WITH NO SIGNIFICANT PATHOLOGIC CHANGES. - NO FEATURES OF SPRUE, ACTIVE INFLAMMATION OR GRANULOMAS. 2. Surgical [P], gastric antrum and gastric body - ANTRAL AND CORPUS MUCOSA WITH REACTIVE GASTROPATHY AND FOCAL INTESTINAL METAPLASIA. - WARTHIN-STARRY STAIN NEGATIVE FOR HELICOBACTER PYLORI. - NO DYSPLASIA OR MALIGNANCY. 3. Surgical [P], distal esophagus - GASTROESOPHAGEAL MUCOSA WITH ULCERATION AND REACTIVE EPITHELIAL CHANGES CONSISTENT WITH SEVERE REFLUX. - NO INTESTINAL METAPLASIA, DYSPLASIA, OR MALIGNANCY  EGD 02/05/2008 by Dr. Collene Mares: Normal  Colonoscopy 11/06/2016: - One 2 mm polyp in the ascending colon, removed with a cold biopsy forceps. Resected and retrieved. - One 4 mm polyp in the ascending colon, removed with a cold snare. Resected and retrieved. - Diverticulosis in the left colon. - The examination was otherwise normal on direct and retroflexion views - 5 year recall colonoscopy  Biopsy Result: Surgical [P], ascending, polyp (2) -TUBULAR ADENOMA AND POLYPOID BENIGN COLONIC MUCOSA. -NO HIGH GRADE DYSPLASIA OR MALIGNANCY IDENTIFIED.  Current Outpatient Medications on File Prior to Visit  Medication Sig Dispense Refill  . betamethasone dipropionate (DIPROLENE) 0.05 % ointment Apply topically 2 (two) times daily. To affected nails 30 g 3  . Omega-3 Fatty Acids (FISH OIL) 1000 MG CAPS Take by mouth daily.    Marland Kitchen VITAMIN D PO Take 2,000 Units by mouth daily.    . Zinc 30 MG TABS Take by mouth.     No current facility-administered medications on file prior to visit.   Allergies  Allergen Reactions  . Esomeprazole Magnesium Hives  . Lansoprazole Hives  . Omeprazole Hives    REACTION: rash  . Nexium [Esomeprazole Magnesium] Hives    Current Medications, Allergies, Past Medical History, Past Surgical History, Family History and Social History were reviewed in [employer] record.  Review of Systems:    Constitutional: Negative for fever, sweats, chills or weight loss.  Respiratory: Negative for shortness of breath.   Cardiovascular: Negative for chest pain, palpitations and leg swelling.  Gastrointestinal: See HPI.  Musculoskeletal: Negative for back pain or muscle aches.  Neurological: Negative for dizziness, headaches or paresthesias.   Physical Exam: LMP 09/24/2013   BP 119/70   Pulse 75   Ht 5' 4"  (1.626 m)   Wt 128 lb (58.1 kg)   LMP 09/24/2013   SpO2 99%   BMI 21.97 kg/m   General: Well developed 60 year old female in no acute distress. Head: Normocephalic and atraumatic. Eyes: No scleral icterus. Conjunctiva pink . Ears: Normal auditory acuity. Mouth: Upper dentures. No ulcers or lesions.  Lungs: Clear throughout to auscultation. Heart: Regular rate and rhythm, no murmur. Abdomen: Soft, nontender and nondistended.  No masses or hepatomegaly. Normal bowel sounds x 4 quadrants.  Rectal: Deferred.  Musculoskeletal: Symmetrical with no gross deformities. Extremities: No edema. Neurological: Alert oriented x 4. No focal deficits.  Psychological: Alert and cooperative. Normal mood and affect  Assessment and Recommendations:  65. 60 year old female with a reported history of celiac disease presents for further evaluation regarding diarrhea x 2 months. No associated abdominal pain or weight loss.  -Check TTG levels to assess for unintentional gluten exposure  -GI pathogen panel, CBC, CMP and TSH -Imodium 1 tab po bid as needed, stop if no BM in 24 hours  -Probiotic of choice once daily -Patient to call office if symptoms worsen   2. History of reflux esophagitis. Infrequent heartburn. No longer on H2 blocker. Allergic to PPIs. EGD 2019 gastric antrum and body biopsies identified intestinal metaplasia, recall EGD advised 06/2021.  -EGD benefits and risks discussed including risk with sedation, risk of bleeding, perforation and infection. Patient elects to schedule EGD at time  of colonoscopy   3.History of tubular adenomatous polyps per colonoscopy 09/2016 -Colonoscopy benefits and risks discussed including risk with sedation, risk of bleeding, perforation and infection   Further follow up to be determined after the above evaluation completed

## 2021-04-10 NOTE — Patient Instructions (Addendum)
If you are age 59 or younger, your body mass index should be between 19-25. Your Body mass index is 21.97 kg/m. If this is out of the aformentioned range listed, please consider follow up with your Primary Care Provider.   PROCEDURES: You have been scheduled for an endoscopy and colonoscopy. Please follow the written instructions given to you at your visit today. If you use inhalers (even only as needed), please bring them with you on the day of your procedure.  LABS:  Lab work has been ordered for you today. Our lab is located in the basement. Press "B" on the elevator. The lab is located at the first door on the left as you exit the elevator.  HEALTHCARE LAWS AND MY CHART RESULTS: Due to recent changes in healthcare laws, you may see the results of your imaging and laboratory studies on MyChart before your provider has had a chance to review them.   We understand that in some cases there may be results that are confusing or concerning to you. Not all laboratory results come back in the same time frame and the provider may be waiting for multiple results in order to interpret others.  Please give [place] 48 hours in order for your provider to thoroughly review all the results before contacting the office for clarification of your results.   RECOMMENDATIONS: Probiotic of your choice, take once a day. Imodium twice a day as needed.  Please call our office if your symptoms worsen.  It was great seeing you today! Thank you for entrusting me with your care and choosing [facility].  Noralyn Pick, CRNP

## 2021-04-11 LAB — TISSUE TRANSGLUTAMINASE ABS,IGG,IGA
(tTG) Ab, IgA: 2.2 U/mL
(tTG) Ab, IgG: <1 U/mL

## 2021-04-12 NOTE — Progress Notes (Signed)
I agree but she probably doesn't need colonoscopy. Newer guidelines show that 2 subCM TAs require 7 year recall, not 5.   Tahbnks

## 2021-04-12 NOTE — Progress Notes (Signed)
Dr. Ardis Hughs, I contacted the patient and explained the new guidelines regarding colon polyps surveillance and due to her small adenomas she is not due for a colonoscopy until 09/2023.   I will keep you updated on her diarrhea work up, if her GI pathogen panel is negative and her diarrhea persists a diagnostic colonoscopy may be warranted. Currently, she is scheduled for EGD/colon with you in July. If her diarrhea abates, I will cancel the colonoscopy component and will enter colonoscopy recall for July 2024 ok?

## 2021-04-13 ENCOUNTER — Other Ambulatory Visit: Payer: BC Managed Care – PPO

## 2021-04-15 LAB — GI PROFILE, STOOL, PCR

## 2021-04-25 ENCOUNTER — Other Ambulatory Visit: Payer: Self-pay

## 2021-04-25 ENCOUNTER — Ambulatory Visit
Admission: RE | Admit: 2021-04-25 | Discharge: 2021-04-25 | Disposition: A | Discharge status: Home / Self Care | Payer: BC Managed Care – PPO | Source: Ambulatory Visit | Attending: Nurse Practitioner | Admitting: Nurse Practitioner

## 2021-04-25 DIAGNOSIS — Z1231 Encounter for screening mammogram for malignant neoplasm of breast: Secondary | ICD-10-CM

## 2021-05-06 ENCOUNTER — Telehealth: Payer: Self-pay | Admitting: Nurse Practitioner

## 2021-05-06 NOTE — Telephone Encounter (Signed)
Victoria Mann, pls contact the patient and let her know if her diarrhea has resolved she does not need a colonoscopy until 10/2023 (that would be her recall date with hx of small adenomas) as verified by Dr. Ardis Hughs. If her diarrhea has resolved pls cancel her colonoscopy, patient to proceed with only EGD as scheduled on 07/06/2021. If she continues to have diarrhea  keep EGD and diagnostic colonoscopy as scheduled. THX

## 2021-05-07 NOTE — Telephone Encounter (Signed)
Spoke with the patient. She can control the diarrhea if she takes Imodium. If she does not take Imodium, she will have in excess of 6 diarrhea stools in a day. Denies weight loss.

## 2021-05-13 NOTE — Telephone Encounter (Signed)
Dr. Ardis Hughs, refer to office visit 04/10/2021 and update message below. She has diarrhea if she does not take Imodium. Are you ok if she keeps her EGD and colonoscopy with you as scheduled?

## 2021-05-14 ENCOUNTER — Encounter: Payer: Self-pay | Admitting: Emergency Medicine

## 2021-05-14 ENCOUNTER — Telehealth (INDEPENDENT_AMBULATORY_CARE_PROVIDER_SITE_OTHER): Payer: BC Managed Care – PPO | Admitting: Emergency Medicine

## 2021-05-14 DIAGNOSIS — J22 Unspecified acute lower respiratory infection: Secondary | ICD-10-CM | POA: Diagnosis not present

## 2021-05-14 DIAGNOSIS — R059 Cough, unspecified: Secondary | ICD-10-CM

## 2021-05-14 MED ORDER — ALBUTEROL SULFATE HFA 108 (90 BASE) MCG/ACT IN AERS: 2.0000 | INHALATION_SPRAY | Freq: Four times a day (QID) | RESPIRATORY_TRACT | 3 refills | Status: DC | PRN

## 2021-05-14 MED ORDER — PREDNISONE 20 MG PO TABS: 20.0000 mg | ORAL_TABLET | Freq: Every day | ORAL | 0 refills | Status: AC

## 2021-05-14 MED ORDER — AZITHROMYCIN 250 MG PO TABS: ORAL_TABLET | ORAL | 0 refills | Status: DC

## 2021-05-14 NOTE — Progress Notes (Signed)
Telemedicine Encounter- SOAP NOTE Established Patient MyChart video conference Patient: Home  Provider: Office   Patient present only  This video encounter was conducted with the patient's (or proxy's) verbal consent via video telecommunications: yes/no: Yes Patient was instructed to have this encounter in a suitably private space; and to only have persons present to whom they give permission to participate. In addition, patient identity was confirmed by use of name plus two identifiers (DOB and address).  I discussed the limitations, risks, security and privacy concerns of performing an evaluation and management service by telephone and the availability of in person appointments. I also discussed with the patient that there may be a patient responsible charge related to this service. The patient expressed understanding and agreed to proceed.  I spent a total of TIME; 0 MIN TO 60 MIN: 20 minutes talking with the patient or their proxy.  No chief complaint on file.   Subjective   Victoria Mann is a 60 y.o. female established patient with history of celiac disease. Telephone visit today complaining of possible "bronchitis".  Complaining of cough productive of yellow phlegm, stuffy head since last week.  Has been taking allergy pills.  Denies fever or chills.  Fully vaccinated against COVID with a booster.  Has had similar prior episodes get better with azithromycin and albuterol inhaler.  Has been on prednisone pills in the past.  Denies any other associated symptoms.  No other complaints or medical concerns today.  HPI   Patient Active Problem List   Diagnosis Date Noted  . 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 Gastroesophageal reflux disease 09/30/2018  . History of celiac disease 06/16/2018  . Psoriasis of nail   . 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 Allergy    SEASONAL  . Asthma    AS CHILD  . Celiac sprue   . GERD (gastroesophageal reflux  disease)   . Psoriasis   . Vitamin D deficiency   . Wears dentures    upper only    Current Outpatient Medications  Medication Sig Dispense Refill  . betamethasone dipropionate (DIPROLENE) 0.05 % ointment Apply topically 2 (two) times daily. To affected nails 30 g 3  . Omega-3 Fatty Acids (FISH OIL) 1000 MG CAPS Take by mouth daily.    Marland Kitchen VITAMIN D PO Take 2,000 Units by mouth daily.    . Zinc 30 MG TABS Take by mouth.     No current facility-administered medications for this visit.    Allergies  Allergen Reactions  . Esomeprazole Magnesium Hives  . Lansoprazole Hives  . Omeprazole Hives    REACTION: rash  . Nexium [Esomeprazole Magnesium] Hives    Social History   Socioeconomic History  . Marital status: Married    Spouse name: Not on file  . Number of children: Not on file  . Years of education: Not on file  . Highest education level: Not on file  Occupational History  . Not on file  Tobacco Use  . Smoking status: Current Every Day Smoker    Packs/day: 0.75    Years: 42.00    Pack years: 31.50    Types: Cigarettes    Start date: 44  . Smokeless tobacco: Never Used  . Tobacco comment: smokes 1/2 pack cigarettes per day  Substance and Sexual Activity  . Alcohol use: Yes    Alcohol/week: 5.0 standard drinks    Types: 5 Shots of liquor per week  .  Drug use: No  . Sexual activity: Yes    Partners: Male    Birth control/protection: Surgical, Post-menopausal    Comment: BTL  Other Topics Concern  . Not on file  Social History Narrative  . Not on file   Social Determinants of Health   Financial Resource Strain: Not on file  Food Insecurity: Not on file  Transportation Needs: Not on file  Physical Activity: Not on file  Stress: Not on file  Social Connections: Not on file  Intimate Partner Violence: Not on file    Review of Systems  Constitutional: Negative.  Negative for chills and fever.  HENT: Positive for congestion. Negative for sore throat.    Respiratory: Positive for cough, sputum production and wheezing. Negative for shortness of breath.   Cardiovascular: Negative for chest pain and palpitations.  Gastrointestinal: Negative.  Negative for abdominal pain, blood in stool, diarrhea, melena, nausea and vomiting.  Genitourinary: Negative.  Negative for dysuria and hematuria.  Skin: Negative.  Negative for rash.  Neurological: Negative.  Negative for dizziness and headaches.  All other systems reviewed and are negative.   Objective  Alert and oriented x3 in no apparent respiratory distress Vitals as reported by the patient: There were no vitals filed for this visit.  Diagnoses and all orders for this visit:  Lower respiratory infection -     azithromycin (ZITHROMAX) 250 MG tablet; Z-Pak for 5 days  Cough -     predniSONE (DELTASONE) 20 MG tablet; Take 1 tablet (20 mg total) by mouth daily with breakfast for 5 days. -     albuterol (VENTOLIN HFA) 108 (90 Base) MCG/ACT inhaler; Inhale 2 puffs into the lungs every 6 (six) hours as needed for wheezing or shortness of breath.  Clinically stable.  No red flag signs or symptoms. Take medications as prescribed, stay well-hydrated, rest, and increase caloric intake. Advised to call the office if no better or worse during the next several days.  ED precautions given.   I discussed the assessment and treatment plan with the patient. The patient was provided an opportunity to ask questions and all were answered. The patient agreed with the plan and demonstrated an understanding of the instructions.   The patient was advised to call back or seek an in-person evaluation if the symptoms worsen or if the condition fails to improve as anticipated.  I provided 20 minutes of non-face-to-face time during this encounter.  Horald Pollen, MD  Primary Care at [facility]

## 2021-05-14 NOTE — Telephone Encounter (Signed)
I called the patient and left a brief message on her personal voicemail to proceed with her EGD and colonoscopy as scheduled and to call our office as needed if her symptoms worsen.

## 2021-05-14 NOTE — Telephone Encounter (Signed)
Yes, I think it is probably [place] worthwhile since she is so reliant on Imodium.  Thanks

## 2021-07-06 ENCOUNTER — Encounter: Payer: Self-pay | Admitting: Gastroenterology

## 2021-07-06 ENCOUNTER — Other Ambulatory Visit: Payer: Self-pay

## 2021-07-06 ENCOUNTER — Ambulatory Visit (AMBULATORY_SURGERY_CENTER): Payer: BC Managed Care – PPO | Admitting: Gastroenterology

## 2021-07-06 VITALS — BP 120/65 | HR 68 | Temp 97.8°F | Resp 18 | Ht 64.0 in | Wt 128.0 lb

## 2021-07-06 DIAGNOSIS — R197 Diarrhea, unspecified: Secondary | ICD-10-CM

## 2021-07-06 DIAGNOSIS — K2100 Gastro-esophageal reflux disease with esophagitis, without bleeding: Secondary | ICD-10-CM | POA: Diagnosis not present

## 2021-07-06 DIAGNOSIS — D125 Benign neoplasm of sigmoid colon: Secondary | ICD-10-CM | POA: Diagnosis not present

## 2021-07-06 DIAGNOSIS — K635 Polyp of colon: Secondary | ICD-10-CM | POA: Diagnosis not present

## 2021-07-06 DIAGNOSIS — K219 Gastro-esophageal reflux disease without esophagitis: Secondary | ICD-10-CM

## 2021-07-06 DIAGNOSIS — K52832 Lymphocytic colitis: Secondary | ICD-10-CM

## 2021-07-06 DIAGNOSIS — Z1211 Encounter for screening for malignant neoplasm of colon: Secondary | ICD-10-CM | POA: Diagnosis not present

## 2021-07-06 DIAGNOSIS — Z86010 Personal history of colonic polyps: Secondary | ICD-10-CM

## 2021-07-06 HISTORY — PX: COLONOSCOPY: SHX174

## 2021-07-06 HISTORY — PX: UPPER GASTROINTESTINAL ENDOSCOPY: SHX188

## 2021-07-06 MED ORDER — FAMOTIDINE 20 MG PO TABS: 20.0000 mg | ORAL_TABLET | Freq: Every day | ORAL | 4 refills | Status: DC

## 2021-07-06 MED ORDER — SODIUM CHLORIDE 0.9 % IV SOLN: 500.0000 mL | Freq: Once | INTRAVENOUS | Status: DC

## 2021-07-06 NOTE — Progress Notes (Signed)
To Pacu, VSS. Report to Rn.tb

## 2021-07-06 NOTE — Progress Notes (Signed)
Called to room to assist during endoscopic procedure.  Patient ID and intended procedure confirmed with present staff. Received instructions for my participation in the procedure from the performing physician.  

## 2021-07-06 NOTE — Patient Instructions (Signed)
Handout given:  Victoria Mann, polyps, diverticulosis Resume previous diet Continue current medications    - Please start famotidine 20 mg at night  Await pathology results  YOU HAD AN ENDOSCOPIC PROCEDURE TODAY AT [place]:   Refer to the procedure report that was given to you for any specific questions about what was found during the examination.  If the procedure report does not answer your questions, please call your gastroenterologist to clarify.  If you requested that your care partner not be given the details of your procedure findings, then the procedure report has been included in a sealed envelope for you to review at your convenience later.  YOU SHOULD EXPECT: Some feelings of bloating in the abdomen. Passage of more gas than usual.  Walking can help get rid of the air that was put into your GI tract during the procedure and reduce the bloating. If you had a lower endoscopy (such as a colonoscopy or flexible sigmoidoscopy) you may notice spotting of blood in your stool or on the toilet paper. If you underwent a bowel prep for your procedure, you may not have a normal bowel movement for a few days.  Please Note:  You might notice some irritation and congestion in your nose or some drainage.  This is from the oxygen used during your procedure.  There is no need for concern and it should clear up in a day or so.  SYMPTOMS TO REPORT IMMEDIATELY:  Following lower endoscopy (colonoscopy or flexible sigmoidoscopy):  Excessive amounts of blood in the stool  Significant tenderness or worsening of abdominal pains  Swelling of the abdomen that is new, acute  Fever of 100F or higher  Following upper endoscopy (EGD)  Vomiting of blood or coffee ground material  New chest pain or pain under the shoulder blades  Painful or persistently difficult swallowing  New shortness of breath  Fever of 100F or higher  Black, tarry-looking stools  For urgent or emergent issues, a  gastroenterologist can be reached at any hour by calling [phone]. Do not use MyChart messaging for urgent concerns.   DIET:  We do recommend a small meal at first, but then you may proceed to your regular diet.  Drink plenty of fluids but you should avoid alcoholic beverages for 24 hours.  ACTIVITY:  You should plan to take it easy for the rest of today and you should NOT DRIVE or use heavy machinery until tomorrow (because of the sedation medicines used during the test).    FOLLOW UP: Our staff will call the number listed on your records 48-72 hours following your procedure to check on you and address any questions or concerns that you may have regarding the information given to you following your procedure. If we do not reach you, we will leave a message.  We will attempt to reach you two times.  During this call, we will ask if you have developed any symptoms of COVID 19. If you develop any symptoms (ie: fever, flu-like symptoms, shortness of breath, cough etc.) before then, please call [phone].  If you test positive for Covid 19 in the 2 weeks post procedure, please call and report this information to [place].    If any biopsies were taken you will be contacted by phone or by letter within the next 1-3 weeks.  Please call us at [phone] if you have not heard about the biopsies in 3 weeks.   SIGNATURES/CONFIDENTIALITY: You and/or your care partner have  signed paperwork which will be entered into your electronic medical record.  These signatures attest to the fact that that the information above on your After Visit Summary has been reviewed and is understood.  Full responsibility of the confidentiality of this discharge information lies with you and/or your care-partner.

## 2021-07-06 NOTE — Op Note (Signed)
[facility] Patient Name: Victoria Mann Procedure Date: 07/06/2021 8:51 AM MRN: [id number] Endoscopist: Milus Banister , MD Age: 60 Referring MD:  Date of Birth: Sep 28, 1961 Gender: Female Account #: [id number] Procedure:                Upper GI endoscopy Indications:              Heartburn, chronic diarrhea, h/o Celiac disease                            (strictly avoids gluten now) Medicines:                Monitored Anesthesia Care Procedure:                Pre-Anesthesia Assessment:                           - Prior to the procedure, a History and Physical                            was performed, and patient medications and                            allergies were reviewed. The patient's tolerance of                            previous anesthesia was also reviewed. The risks                            and benefits of the procedure and the sedation                            options and risks were discussed with the patient.                            All questions were answered, and informed consent                            was obtained. Prior Anticoagulants: The patient has                            taken no previous anticoagulant or antiplatelet                            agents. ASA Grade Assessment: II - A patient with                            mild systemic disease. After reviewing the risks                            and benefits, the patient was deemed in                            satisfactory condition to undergo the procedure.  After obtaining informed consent, the endoscope was                            passed under direct vision. Throughout the                            procedure, the patient's blood pressure, pulse, and                            oxygen saturations were monitored continuously. The                            GIF [id number] #[id number] was introduced through the                            mouth, and advanced to the  second part of duodenum.                            The upper GI endoscopy was accomplished without                            difficulty. The patient tolerated the procedure                            well. Scope In: Scope Out: Findings:                 LA Grade B (one or more mucosal breaks greater than                            5 mm, not extending between the tops of two mucosal                            folds) esophagitis with bleeding was found in the                            lower third of the esophagus.                           Biopsies for histology were taken with a cold                            forceps in the entire duodenum for evaluation of                            celiac disease.                           The exam was otherwise without abnormality. Complications:            No immediate complications. Estimated blood loss:                            None. Estimated Blood Loss:     Estimated blood loss: none. Impression:               -  LA Grade B reflux esophagitis with bleeding.                           - The examination was otherwise normal.                           - Biopsies were taken with a cold forceps for                            evaluation of celiac disease. Recommendation:           - Patient has a contact number available for                            emergencies. The signs and symptoms of potential                            delayed complications were discussed with the                            patient. Return to normal activities tomorrow.                            Written discharge instructions were provided to the                            patient.                           - Resume previous diet.                           - Continue present medications. Please start pepcid                            (famotidine) 31m pills, one pill at bedtime                            nightly for your GERD, esophagitis.                           - Await  pathology results. DMilus Banister MD 07/06/2021 9:26:37 AM This report has been signed electronically.

## 2021-07-06 NOTE — Addendum Note (Signed)
Addended by: Lanier Prude A on: 07/06/2021 10:47 AM   Modules accepted: Orders

## 2021-07-06 NOTE — Op Note (Signed)
[place] Patient Name: Victoria Mann Procedure Date: 07/06/2021 8:52 AM MRN: [id number] Endoscopist: Milus Banister , MD Age: 60 Referring MD:  Date of Birth: 12-12-1961 Gender: Female Account #: [id number] Procedure:                Colonoscopy Indications:              Chronic diarrhea for several months, known Celiac;                            colonoscopy 2017 two subCM adenomas Medicines:                Monitored Anesthesia Care Procedure:                Pre-Anesthesia Assessment:                           - Prior to the procedure, a History and Physical                            was performed, and patient medications and                            allergies were reviewed. The patient's tolerance of                            previous anesthesia was also reviewed. The risks                            and benefits of the procedure and the sedation                            options and risks were discussed with the patient.                            All questions were answered, and informed consent                            was obtained. Prior Anticoagulants: The patient has                            taken no previous anticoagulant or antiplatelet                            agents. ASA Grade Assessment: II - A patient with                            mild systemic disease. After reviewing the risks                            and benefits, the patient was deemed in                            satisfactory condition to undergo the procedure.  After obtaining informed consent, the colonoscope                            was passed under direct vision. Throughout the                            procedure, the patient's blood pressure, pulse, and                            oxygen saturations were monitored continuously. The                            Olympus [id number]-[id number] ([id number]) Colonoscope was                            introduced through the  anus and advanced to the the                            terminal ileum. The colonoscopy was performed                            without difficulty. The patient tolerated the                            procedure well. The quality of the bowel                            preparation was good. The terminal ileum, ileocecal                            valve, appendiceal orifice, and rectum were                            photographed. Scope In: 8:59:28 AM Scope Out: 9:13:59 AM Scope Withdrawal Time: 0 hours 11 minutes 19 seconds  Total Procedure Duration: 0 hours 14 minutes 31 seconds  Findings:                 The terminal ileum appeared normal.                           A 3 mm polyp was found in the sigmoid colon. The                            polyp was sessile. The polyp was removed with a                            cold snare. Resection and retrieval were complete.                           Biopsies for histology were taken with a cold                            forceps from the entire colon for evaluation of  microscopic colitis.                           Multiple small and large-mouthed diverticula were                            found in the left colon.                           The exam was otherwise without abnormality on                            direct and retroflexion views. Complications:            No immediate complications. Estimated blood loss:                            None. Estimated Blood Loss:     Estimated blood loss: none. Impression:               - The examined portion of the ileum was normal.                           - One 3 mm polyp in the sigmoid colon, removed with                            a cold snare. Resected and retrieved.                           - Diverticulosis in the left colon.                           - The examination was otherwise normal on direct                            and retroflexion views.                            - Biopsies were taken with a cold forceps from the                            entire colon for evaluation of microscopic colitis. Recommendation:           - Await pathology results.                           - EGD now. Milus Banister, MD 07/06/2021 9:16:29 AM This report has been signed electronically.

## 2021-07-10 ENCOUNTER — Telehealth: Payer: Self-pay | Admitting: *Deleted

## 2021-07-10 NOTE — Telephone Encounter (Signed)
  Follow up Call-  Call back number 07/06/2021  Post procedure Call Back phone  # [phone]  Permission to leave phone message Yes  Some recent data might be hidden     Patient questions:  Do you have a fever, pain , or abdominal swelling? No. Pain Score  0 *  Have you tolerated food without any problems? Yes.    Have you been able to return to your normal activities? Yes.    Do you have any questions about your discharge instructions: Diet   No. Medications  No. Follow up visit  No.  Do you have questions or concerns about your Care? No.  Actions: * If pain score is 4 or above: No action needed, pain <4.

## 2021-07-13 ENCOUNTER — Other Ambulatory Visit: Payer: Self-pay

## 2021-07-13 MED ORDER — BUDESONIDE 3 MG PO CPEP: 9.0000 mg | ORAL_CAPSULE | Freq: Every day | ORAL | 5 refills | Status: DC

## 2021-09-11 ENCOUNTER — Ambulatory Visit: Payer: BC Managed Care – PPO | Admitting: Gastroenterology

## 2021-09-11 ENCOUNTER — Encounter: Payer: Self-pay | Admitting: Gastroenterology

## 2021-09-11 VITALS — BP 120/60 | HR 76 | Ht 64.0 in | Wt 133.0 lb

## 2021-09-11 DIAGNOSIS — K219 Gastro-esophageal reflux disease without esophagitis: Secondary | ICD-10-CM | POA: Diagnosis not present

## 2021-09-11 DIAGNOSIS — K52832 Lymphocytic colitis: Secondary | ICD-10-CM | POA: Diagnosis not present

## 2021-09-11 NOTE — Patient Instructions (Addendum)
If you are age 59 or younger, your body mass index should be between 19-25. Your Body mass index is 22.83 kg/m. If this is out of the aformentioned range listed, please consider follow up with your Primary Care Provider.  __________________________________________________________  The [facility] providers would like to encourage you to use [facility] to communicate with providers for non-urgent requests or questions.  Due to long hold times on the telephone, sending your provider a message by [facility] may be a faster and more efficient way to get a response.  Please allow 48 business hours for a response.  Please remember that this is for non-urgent requests.   We recommend that you start cutting back on budesonide over the next 4 weeks.  Please call our office or send a MyChart message in 6 weeks to report on your progress.  Please continue taking Pepcid 18m one pill at bedtime every night.  Thank you for entrusting me with your care and [facility]  Dr JArdis Hughs

## 2021-09-11 NOTE — Progress Notes (Signed)
Review of pertinent gastrointestinal problems: 1.  Heartburn and history of celiac disease led to EGD July 2022 that showed LA grade B reflux related esophagitis biopsies were taken from the duodenum to check for pathologic celiac sprue.  She was recommended to start taking Pepcid 20 mg pills 1 pill at bedtime every night for her esophagitis.  Biopsies from the duodenum did not show any evidence of pathologic sprue 2.  Chronic diarrhea for several months led to colonoscopy July 2022.  Terminal ileum was normal, 3 mm polyp was removed from the sigmoid colon, diverticulosis was found throughout the colon.  Polyp was not precancerous.  Biopsies were positive for lymphocytic colitis and I started her on budesonide 9 mg once daily. 3.  History of adenomatous colon polyps.  2 subcentimeter adenomas removed by colonoscopy 2017.  Colonoscopy 2022 single subcentimeter polyp was removed and it was not precancerous.   HPI: This is a very pleasant 60 year old woman whom I last saw about 2 months ago the time of a colonoscopy and upper endoscopy.  See those results summarized above  Her weight is up 5 pounds since her last office visit here, same scale, 5 months ago.  She started budesonide 9 mg once daily and within about 2 weeks her bowels had significantly improved back to nearly normal.  Prior to starting the Entocort she was having 10-12 very urgent loose stools daily.  Lately she is having 1 BM that is soft, solid, [place] associated with a bit of urgency.  She has 0 GERD symptoms and wonders why she should be taking famotidine.   ROS: complete GI ROS as described in HPI, all other review negative.  Constitutional:  No unintentional weight loss   Past Medical History:  Diagnosis Date   Allergy    SEASONAL   Asthma    AS CHILD   Celiac sprue    GERD (gastroesophageal reflux disease)    Psoriasis    Vitamin D deficiency    Wears dentures    upper only    Past Surgical History:  Procedure  Laterality Date   COLONOSCOPY  07/06/2021   HERNIA REPAIR     KNEE ARTHROSCOPY  1985   LEFT   PELVIC LAPAROSCOPY  12/2010   To remove scar tissue   TUBAL LIGATION  2751   UMBILICAL HERNIA REPAIR  12/2008   UPPER GASTROINTESTINAL ENDOSCOPY  07/06/2021   UPPER GI ENDOSCOPY  2009   f/u celiac     Current Outpatient Medications  Medication Sig Dispense Refill   albuterol (VENTOLIN HFA) 108 (90 Base) MCG/ACT inhaler Inhale 2 puffs into the lungs every 6 (six) hours as needed for wheezing or shortness of breath. 1 each 3   betamethasone dipropionate (DIPROLENE) 0.05 % ointment Apply topically 2 (two) times daily. To affected nails 30 g 3   budesonide (ENTOCORT EC) 3 MG 24 hr capsule Take 3 capsules (9 mg total) by mouth daily. 90 capsule 5   famotidine (PEPCID) 20 MG tablet Take 1 tablet (20 mg total) by mouth daily. 90 tablet 4   VITAMIN D PO Take 2,000 Units by mouth daily.     No current facility-administered medications for this visit.    Allergies as of 09/11/2021 - Review Complete 09/11/2021  Allergen Reaction Noted   Lansoprazole Hives 10/30/2010   Omeprazole Hives    Nexium [esomeprazole magnesium] Hives 11/29/2019    Family History  Problem Relation Age of Onset   Diabetes Mother    Hypertension Mother  Hyperlipidemia Mother    Cancer Father        LUNG   Colon polyps Father    Healthy Sister    Healthy Brother    Heart disease Maternal Grandmother    Cancer Paternal Grandfather    Healthy Brother    Healthy Brother    Colon cancer Neg Hx    Esophageal cancer Neg Hx    Stomach cancer Neg Hx    Rectal cancer Neg Hx     Social History   Socioeconomic History   Marital status: Married    Spouse name: Not on file   Number of children: Not on file   Years of education: Not on file   Highest education level: Not on file  Occupational History   Not on file  Tobacco Use   Smoking status: Every Day    Packs/day: 0.75    Years: 42.00    Pack years: 31.50     Types: Cigarettes    Start date: 1980   Smokeless tobacco: Never   Tobacco comments:    smokes 1/2 pack cigarettes per day  Substance and Sexual Activity   Alcohol use: Yes    Alcohol/week: 5.0 standard drinks    Types: 5 Shots of liquor per week   Drug use: No   Sexual activity: Yes    Partners: Male    Birth control/protection: Surgical, Post-menopausal    Comment: BTL  Other Topics Concern   Not on file  Social History Narrative   Not on file   Social Determinants of Health   Financial Resource Strain: Not on file  Food Insecurity: Not on file  Transportation Needs: Not on file  Physical Activity: Not on file  Stress: Not on file  Social Connections: Not on file  Intimate Partner Violence: Not on file     Physical Exam: BP 120/60   Pulse 76   Ht 5' 4"  (1.626 m)   Wt 133 lb (60.3 kg)   LMP 09/24/2013   BMI 22.83 kg/m  Constitutional: generally well-appearing Psychiatric: alert and oriented x3 Abdomen: soft, nontender, nondistended, no obvious ascites, no peritoneal signs, normal bowel sounds No peripheral edema noted in lower extremities  Assessment and plan: 60 y.o. female with esophagitis, lymphocytic colitis  She has had a good response to 9 mg of budesonide once daily.  I recommended she start cutting back on this as she tapers to 0 over about the next 4 weeks.  She will call to report on her response in 6 weeks and she knows if she has difficulty with recurrent diarrhea prior to then to contact me sooner.  We had a nice discussion about her silent GERD.  I saw LA grade B reflux related esophagitis at the time of her EGD.  She smokes cigarettes, sometimes eats meals late at night.  I explained to her that I do understand that she really has no GERD symptoms but I tried to explain to her that she has having acid wash up onto her stomach and damage her esophagus and over time this could worsen and so I think in the end she did agree to continue taking Pepcid  20 mg 1 pill at bedtime every night.  Please see the "Patient Instructions" section for addition details about the plan.  Owens Loffler, MD [facility] 09/11/2021, 2:49 PM   Total time on date of encounter was 30 minutes (this included time spent preparing to see the patient reviewing records; obtaining and/or reviewing separately  obtained history; performing a medically appropriate exam and/or evaluation; counseling and educating the patient and family if present; ordering medications, tests or procedures if applicable; and documenting clinical information in the health record).

## 2021-12-10 ENCOUNTER — Ambulatory Visit (INDEPENDENT_AMBULATORY_CARE_PROVIDER_SITE_OTHER): Payer: BC Managed Care – PPO

## 2021-12-10 ENCOUNTER — Other Ambulatory Visit: Payer: Self-pay

## 2021-12-10 ENCOUNTER — Ambulatory Visit: Payer: BC Managed Care – PPO | Admitting: Podiatry

## 2021-12-10 DIAGNOSIS — M7672 Peroneal tendinitis, left leg: Secondary | ICD-10-CM | POA: Diagnosis not present

## 2021-12-10 DIAGNOSIS — M722 Plantar fascial fibromatosis: Secondary | ICD-10-CM

## 2021-12-10 NOTE — Patient Instructions (Signed)

## 2021-12-12 NOTE — Progress Notes (Signed)
Subjective: 60 year old female presents the office today for concerns of pain mostly to the lateral aspect of her left foot.  He thinks it could be coming from having the heel pain and walking differently.  She denies any recent injury or trauma to her foot.  No numbness or tingling.  No other concerns.  Objective: AAO x3, NAD DP/PT pulses palpable bilaterally, CRT less than 3 seconds Tenderness palpation of the course of the peroneal tendon and along the insertion of the metatarsal base but also the lateral aspect the fifth metatarsal base mild prominence noted to this area.  No significant edema.  No erythema.  Mild discomfort Marrufo present on the plantar medial tubercle of the calcaneus at the insertion of plantar fascial.  No other areas of discomfort.  MMT 5/5.  Clinically the tendon appears to be intact. No pain with calf compression, swelling, warmth, erythema  Assessment: Peroneal tendinitis, Planter fasciitis  Plan: -All treatment options discussed with the patient including all alternatives, risks, complications.  -I do think that her symptoms are coming from compensation.  We discussed different treatment options.  Steroid injection was formed after discussing risks.  See procedure note below.  Recommend immobilization in cam boot for the next 1 to 2 weeks.  As she starts to feel better at least in 1 week she can start to transition to regular shoe.  Discussed icing, stretching. -Patient encouraged to call the office with any questions, concerns, change in symptoms.   Procedure: Injection Tendon/Ligament Discussed alternatives, risks, complications and verbal consent was obtained.  Location: Left fifth metatarsal base lateral to the metatarsal away from the peroneal tendon. Skin Prep: Alcohol. Injectate: 0.5 cc dexamethasone phosphate, 0.5 cc of Marcaine plain Disposition: Patient tolerated procedure well. Injection site dressed with a band-aid.  Post-injection care was discussed  and return precautions discussed.   Trula Slade DPM

## 2021-12-25 ENCOUNTER — Other Ambulatory Visit: Payer: Self-pay | Admitting: *Deleted

## 2021-12-25 ENCOUNTER — Telehealth: Payer: Self-pay | Admitting: *Deleted

## 2021-12-25 NOTE — Telephone Encounter (Signed)
[facility] is calling to request a referral to be faxed  over to [phone].

## 2021-12-28 DIAGNOSIS — R262 Difficulty in walking, not elsewhere classified: Secondary | ICD-10-CM | POA: Diagnosis not present

## 2021-12-28 DIAGNOSIS — M25372 Other instability, left ankle: Secondary | ICD-10-CM | POA: Diagnosis not present

## 2021-12-28 DIAGNOSIS — R531 Weakness: Secondary | ICD-10-CM | POA: Diagnosis not present

## 2021-12-28 DIAGNOSIS — M25572 Pain in left ankle and joints of left foot: Secondary | ICD-10-CM | POA: Diagnosis not present

## 2021-12-31 ENCOUNTER — Other Ambulatory Visit: Payer: Self-pay

## 2021-12-31 DIAGNOSIS — M25372 Other instability, left ankle: Secondary | ICD-10-CM | POA: Diagnosis not present

## 2021-12-31 DIAGNOSIS — R262 Difficulty in walking, not elsewhere classified: Secondary | ICD-10-CM | POA: Diagnosis not present

## 2021-12-31 DIAGNOSIS — R531 Weakness: Secondary | ICD-10-CM | POA: Diagnosis not present

## 2021-12-31 DIAGNOSIS — M25572 Pain in left ankle and joints of left foot: Secondary | ICD-10-CM | POA: Diagnosis not present

## 2021-12-31 MED ORDER — FAMOTIDINE 20 MG PO TABS: 20.0000 mg | ORAL_TABLET | Freq: Every day | ORAL | 3 refills | Status: DC

## 2022-01-01 DIAGNOSIS — M25372 Other instability, left ankle: Secondary | ICD-10-CM | POA: Diagnosis not present

## 2022-01-01 DIAGNOSIS — R262 Difficulty in walking, not elsewhere classified: Secondary | ICD-10-CM | POA: Diagnosis not present

## 2022-01-01 DIAGNOSIS — R531 Weakness: Secondary | ICD-10-CM | POA: Diagnosis not present

## 2022-01-01 DIAGNOSIS — M25572 Pain in left ankle and joints of left foot: Secondary | ICD-10-CM | POA: Diagnosis not present

## 2022-01-04 DIAGNOSIS — R531 Weakness: Secondary | ICD-10-CM | POA: Diagnosis not present

## 2022-01-04 DIAGNOSIS — M25572 Pain in left ankle and joints of left foot: Secondary | ICD-10-CM | POA: Diagnosis not present

## 2022-01-04 DIAGNOSIS — M25372 Other instability, left ankle: Secondary | ICD-10-CM | POA: Diagnosis not present

## 2022-01-04 DIAGNOSIS — R262 Difficulty in walking, not elsewhere classified: Secondary | ICD-10-CM | POA: Diagnosis not present

## 2022-01-07 DIAGNOSIS — M25572 Pain in left ankle and joints of left foot: Secondary | ICD-10-CM | POA: Diagnosis not present

## 2022-01-07 DIAGNOSIS — R262 Difficulty in walking, not elsewhere classified: Secondary | ICD-10-CM | POA: Diagnosis not present

## 2022-01-07 DIAGNOSIS — R531 Weakness: Secondary | ICD-10-CM | POA: Diagnosis not present

## 2022-01-07 DIAGNOSIS — M25372 Other instability, left ankle: Secondary | ICD-10-CM | POA: Diagnosis not present

## 2022-01-08 DIAGNOSIS — R262 Difficulty in walking, not elsewhere classified: Secondary | ICD-10-CM | POA: Diagnosis not present

## 2022-01-08 DIAGNOSIS — M25372 Other instability, left ankle: Secondary | ICD-10-CM | POA: Diagnosis not present

## 2022-01-08 DIAGNOSIS — R531 Weakness: Secondary | ICD-10-CM | POA: Diagnosis not present

## 2022-01-08 DIAGNOSIS — M25572 Pain in left ankle and joints of left foot: Secondary | ICD-10-CM | POA: Diagnosis not present

## 2022-01-18 DIAGNOSIS — M25372 Other instability, left ankle: Secondary | ICD-10-CM | POA: Diagnosis not present

## 2022-01-18 DIAGNOSIS — M25572 Pain in left ankle and joints of left foot: Secondary | ICD-10-CM | POA: Diagnosis not present

## 2022-01-18 DIAGNOSIS — R531 Weakness: Secondary | ICD-10-CM | POA: Diagnosis not present

## 2022-01-18 DIAGNOSIS — R262 Difficulty in walking, not elsewhere classified: Secondary | ICD-10-CM | POA: Diagnosis not present

## 2022-01-23 DIAGNOSIS — R531 Weakness: Secondary | ICD-10-CM | POA: Diagnosis not present

## 2022-01-23 DIAGNOSIS — R262 Difficulty in walking, not elsewhere classified: Secondary | ICD-10-CM | POA: Diagnosis not present

## 2022-01-23 DIAGNOSIS — M25372 Other instability, left ankle: Secondary | ICD-10-CM | POA: Diagnosis not present

## 2022-01-23 DIAGNOSIS — M25572 Pain in left ankle and joints of left foot: Secondary | ICD-10-CM | POA: Diagnosis not present

## 2022-01-25 DIAGNOSIS — M25372 Other instability, left ankle: Secondary | ICD-10-CM | POA: Diagnosis not present

## 2022-01-25 DIAGNOSIS — R262 Difficulty in walking, not elsewhere classified: Secondary | ICD-10-CM | POA: Diagnosis not present

## 2022-01-25 DIAGNOSIS — M25572 Pain in left ankle and joints of left foot: Secondary | ICD-10-CM | POA: Diagnosis not present

## 2022-01-25 DIAGNOSIS — R531 Weakness: Secondary | ICD-10-CM | POA: Diagnosis not present

## 2022-01-28 DIAGNOSIS — R531 Weakness: Secondary | ICD-10-CM | POA: Diagnosis not present

## 2022-01-28 DIAGNOSIS — R262 Difficulty in walking, not elsewhere classified: Secondary | ICD-10-CM | POA: Diagnosis not present

## 2022-01-28 DIAGNOSIS — M25372 Other instability, left ankle: Secondary | ICD-10-CM | POA: Diagnosis not present

## 2022-01-28 DIAGNOSIS — M25572 Pain in left ankle and joints of left foot: Secondary | ICD-10-CM | POA: Diagnosis not present

## 2022-02-01 ENCOUNTER — Ambulatory Visit: Payer: BC Managed Care – PPO | Admitting: Podiatry

## 2022-02-01 ENCOUNTER — Other Ambulatory Visit: Payer: Self-pay

## 2022-02-01 DIAGNOSIS — M79672 Pain in left foot: Secondary | ICD-10-CM | POA: Diagnosis not present

## 2022-02-01 DIAGNOSIS — R262 Difficulty in walking, not elsewhere classified: Secondary | ICD-10-CM | POA: Diagnosis not present

## 2022-02-01 DIAGNOSIS — G8929 Other chronic pain: Secondary | ICD-10-CM | POA: Diagnosis not present

## 2022-02-01 DIAGNOSIS — M7672 Peroneal tendinitis, left leg: Secondary | ICD-10-CM

## 2022-02-01 DIAGNOSIS — R531 Weakness: Secondary | ICD-10-CM | POA: Diagnosis not present

## 2022-02-01 DIAGNOSIS — M722 Plantar fascial fibromatosis: Secondary | ICD-10-CM

## 2022-02-01 DIAGNOSIS — M25572 Pain in left ankle and joints of left foot: Secondary | ICD-10-CM | POA: Diagnosis not present

## 2022-02-01 DIAGNOSIS — M25372 Other instability, left ankle: Secondary | ICD-10-CM | POA: Diagnosis not present

## 2022-02-05 DIAGNOSIS — M25372 Other instability, left ankle: Secondary | ICD-10-CM | POA: Diagnosis not present

## 2022-02-05 DIAGNOSIS — R262 Difficulty in walking, not elsewhere classified: Secondary | ICD-10-CM | POA: Diagnosis not present

## 2022-02-05 DIAGNOSIS — R531 Weakness: Secondary | ICD-10-CM | POA: Diagnosis not present

## 2022-02-05 DIAGNOSIS — M25572 Pain in left ankle and joints of left foot: Secondary | ICD-10-CM | POA: Diagnosis not present

## 2022-02-05 NOTE — Progress Notes (Signed)
Subjective: 61 year old female presents the office today for evaluation of pain to the lateral aspect of the left foot as well as heel pain.  She states the injection was not very helpful.  She essentially has pain when she touches the area and not sitting with walking.  She discontinued feel more of a sensation on her heels.  She has been doing physical therapy and she is on her 12th visit.  She does feel it has been helping some.  No recent injury or changes otherwise.  No other concerns.  Objective: AAO x3, NAD DP/PT pulses palpable bilaterally, CRT less than 3 seconds On the left foot there is tenderness palpation to the left fifth metatarsal base.  Trace edema but there is no erythema or warmth.  No pain on the peroneal tendon otherwise.  On the plantar aspect the calcaneus is a prominence of the calcaneus and a small mobile soft tissue masses palpable consistent with a small bursa.  There is no pain about compression of calcaneus.  No edema, erythema.  Flexor, extensor tendon.  Intact.  MMT 5/5. No pain with calf compression, swelling, warmth, erythema  Assessment: Social peroneal tendinitis, plantar heel pain, concern for possible small bursa  Plan: -All treatment options discussed with the patient including all alternatives, risks, complications.  -Given the atrophy the fat pad of the heel recommend hold off on steroid injections.  Discussed inserts as well as offloading for the heel.  Continue with physical therapy, stretching, icing daily. -Follow-up on a steroid injection for the fifth metatarsal base that this was not helpful as well.  Continue physical therapy. -If symptoms continue then recommend MRI-previous MRI 2021 showed small mount of fluid on the medial band of plantar fascia consistent with plantar fasciitis.  Trula Slade DPM

## 2022-02-07 DIAGNOSIS — M25572 Pain in left ankle and joints of left foot: Secondary | ICD-10-CM | POA: Diagnosis not present

## 2022-02-07 DIAGNOSIS — M25372 Other instability, left ankle: Secondary | ICD-10-CM | POA: Diagnosis not present

## 2022-02-07 DIAGNOSIS — R262 Difficulty in walking, not elsewhere classified: Secondary | ICD-10-CM | POA: Diagnosis not present

## 2022-02-07 DIAGNOSIS — R531 Weakness: Secondary | ICD-10-CM | POA: Diagnosis not present

## 2022-02-28 ENCOUNTER — Encounter: Payer: Self-pay | Admitting: Gastroenterology

## 2022-05-27 ENCOUNTER — Other Ambulatory Visit: Payer: Self-pay | Admitting: Nurse Practitioner

## 2022-05-27 DIAGNOSIS — Z1231 Encounter for screening mammogram for malignant neoplasm of breast: Secondary | ICD-10-CM

## 2022-05-30 ENCOUNTER — Ambulatory Visit
Admission: RE | Admit: 2022-05-30 | Discharge: 2022-05-30 | Disposition: A | Discharge status: Home / Self Care | Payer: BC Managed Care – PPO | Source: Ambulatory Visit | Attending: Nurse Practitioner | Admitting: Nurse Practitioner

## 2022-05-30 DIAGNOSIS — Z1231 Encounter for screening mammogram for malignant neoplasm of breast: Secondary | ICD-10-CM

## 2022-06-03 DIAGNOSIS — M79672 Pain in left foot: Secondary | ICD-10-CM | POA: Diagnosis not present

## 2022-06-03 DIAGNOSIS — M79671 Pain in right foot: Secondary | ICD-10-CM | POA: Diagnosis not present

## 2022-06-13 DIAGNOSIS — S93601A Unspecified sprain of right foot, initial encounter: Secondary | ICD-10-CM | POA: Diagnosis not present

## 2022-06-13 DIAGNOSIS — S92355A Nondisplaced fracture of fifth metatarsal bone, left foot, initial encounter for closed fracture: Secondary | ICD-10-CM | POA: Diagnosis not present

## 2022-06-24 ENCOUNTER — Encounter: Payer: Self-pay | Admitting: Family Medicine

## 2022-06-24 ENCOUNTER — Ambulatory Visit: Payer: BC Managed Care – PPO | Admitting: Family Medicine

## 2022-06-24 VITALS — BP 140/66 | HR 85 | Temp 97.8°F | Ht 64.0 in | Wt 134.0 lb

## 2022-06-24 DIAGNOSIS — R051 Acute cough: Secondary | ICD-10-CM | POA: Diagnosis not present

## 2022-06-24 DIAGNOSIS — J040 Acute laryngitis: Secondary | ICD-10-CM | POA: Insufficient documentation

## 2022-06-24 DIAGNOSIS — J0140 Acute pansinusitis, unspecified: Secondary | ICD-10-CM | POA: Diagnosis not present

## 2022-06-24 DIAGNOSIS — F17210 Nicotine dependence, cigarettes, uncomplicated: Secondary | ICD-10-CM | POA: Diagnosis not present

## 2022-06-24 DIAGNOSIS — F17200 Nicotine dependence, unspecified, uncomplicated: Secondary | ICD-10-CM | POA: Insufficient documentation

## 2022-06-24 MED ORDER — AMOXICILLIN 875 MG PO TABS: 875.0000 mg | ORAL_TABLET | Freq: Two times a day (BID) | ORAL | 0 refills | Status: AC

## 2022-06-24 NOTE — Patient Instructions (Signed)
Take the antibiotic as prescribed.  I also recommend using Flonase nasal spray or Nasacort would be okay as well.  This will help with the congestion in your ears.  You may continue pseudoephedrine for congestion also.  You can do salt water gargles for your laryngitis.  Chloraseptic or throat lozenges will help.  I recommend Mucinex for congestion and drinking plenty of water.  Follow-up if you are getting worse or if you are not improving in the next 3 to 4 days.

## 2022-06-24 NOTE — Progress Notes (Signed)
Subjective:  Victoria Mann is a 61 y.o. female who presents for a 2 week hx of worsening URI symptoms.  Cough is now productive.  Lost her voice yesterday.   Denies fever, chills, dizziness, chest pain, palpitations, shortness of breath, abdominal pain, N/V/D.   Taking Pseudoephedrine and Claritin.  Smoking but is working on stopping.   No other aggravating or relieving factors.  No other c/o.  ROS as in subjective.   Objective: Vitals:   06/24/22 0913  BP: 140/66  Pulse: 85  Temp: 97.8 F (36.6 C)  SpO2: 96%    General appearance: Alert, WD/WN, no distress, mildly ill appearing                             Skin: warm, no rash                           Head: maxillary sinus tenderness                            Eyes: conjunctiva normal, corneas clear, PERRLA                            Ears: bilateral TMs dull, worse on left with fluid behind TM, external ear canals normal                          Nose: septum midline, turbinates swollen, with erythema             Mouth/throat: MMM, tongue normal, mild pharyngeal erythema                           Neck: supple, no adenopathy, no thyromegaly, nontender                          Heart: RRR                         Lungs: CTA bilaterally, no wheezes, rales, or rhonchi      Assessment: Acute non-recurrent pansinusitis - Plan: amoxicillin (AMOXIL) 875 MG tablet  Acute cough  Laryngitis, acute  Cigarette smoker motivated to quit   Plan: Discussed diagnosis and treatment acute sinusitis and laryngitis. Amoxil prescribed.   Suggested symptomatic OTC remedies. Nasal saline spray and Flonase for congestion. Salt water gargles. Mucinex.  Tylenol or Ibuprofen OTC for fever and malaise.  Call/return in 3-4 days if symptoms aren't improving or if not back to baseline when she completes the antibiotic.

## 2022-07-11 DIAGNOSIS — S93601A Unspecified sprain of right foot, initial encounter: Secondary | ICD-10-CM | POA: Diagnosis not present

## 2022-07-11 DIAGNOSIS — S92355A Nondisplaced fracture of fifth metatarsal bone, left foot, initial encounter for closed fracture: Secondary | ICD-10-CM | POA: Diagnosis not present

## 2022-08-22 DIAGNOSIS — S93601A Unspecified sprain of right foot, initial encounter: Secondary | ICD-10-CM | POA: Diagnosis not present

## 2022-08-22 DIAGNOSIS — S92355A Nondisplaced fracture of fifth metatarsal bone, left foot, initial encounter for closed fracture: Secondary | ICD-10-CM | POA: Diagnosis not present

## 2022-09-23 ENCOUNTER — Ambulatory Visit: Payer: BC Managed Care – PPO | Admitting: Emergency Medicine

## 2022-09-23 ENCOUNTER — Encounter: Payer: Self-pay | Admitting: *Deleted

## 2022-09-23 ENCOUNTER — Ambulatory Visit (INDEPENDENT_AMBULATORY_CARE_PROVIDER_SITE_OTHER): Payer: BC Managed Care – PPO

## 2022-09-23 ENCOUNTER — Encounter: Payer: Self-pay | Admitting: Emergency Medicine

## 2022-09-23 VITALS — BP 120/62 | HR 80 | Temp 98.3°F | Ht 64.0 in | Wt 135.0 lb

## 2022-09-23 DIAGNOSIS — R051 Acute cough: Secondary | ICD-10-CM | POA: Diagnosis not present

## 2022-09-23 DIAGNOSIS — J22 Unspecified acute lower respiratory infection: Secondary | ICD-10-CM | POA: Diagnosis not present

## 2022-09-23 DIAGNOSIS — R059 Cough, unspecified: Secondary | ICD-10-CM | POA: Diagnosis not present

## 2022-09-23 DIAGNOSIS — R062 Wheezing: Secondary | ICD-10-CM | POA: Diagnosis not present

## 2022-09-23 DIAGNOSIS — F17200 Nicotine dependence, unspecified, uncomplicated: Secondary | ICD-10-CM | POA: Diagnosis not present

## 2022-09-23 MED ORDER — AZITHROMYCIN 250 MG PO TABS: ORAL_TABLET | ORAL | 0 refills | Status: DC

## 2022-09-23 MED ORDER — PREDNISONE 20 MG PO TABS: 40.0000 mg | ORAL_TABLET | Freq: Every day | ORAL | 0 refills | Status: AC

## 2022-09-23 MED ORDER — BENZONATATE 200 MG PO CAPS: 200.0000 mg | ORAL_CAPSULE | Freq: Two times a day (BID) | ORAL | 0 refills | Status: DC | PRN

## 2022-09-23 MED ORDER — ALBUTEROL SULFATE HFA 108 (90 BASE) MCG/ACT IN AERS: 2.0000 | INHALATION_SPRAY | Freq: Four times a day (QID) | RESPIRATORY_TRACT | 3 refills | Status: DC | PRN

## 2022-09-23 NOTE — Progress Notes (Signed)
Victoria Mann 61 y.o.   Chief Complaint  Patient presents with   Cough    Started on friday9/29 , hurts on right side of abdomen when coughing    HISTORY OF PRESENT ILLNESS: Acute problem visit today. This is a 61 y.o. female complaining of cough and flulike symptoms that started 4 days ago.  Husband also sick with similar symptoms. No other complaints or medical concerns today.  Smoker.  Cough Associated symptoms include wheezing. Pertinent negatives include no chest pain, chills, fever, headaches, rash or shortness of breath.     Prior to Admission medications   Medication Sig Start Date End Date Taking? Authorizing Provider  albuterol (VENTOLIN HFA) 108 (90 Base) MCG/ACT inhaler Inhale 2 puffs into the lungs every 6 (six) hours as needed for wheezing or shortness of breath. 05/14/21  Yes Ashlin Kreps, Ines Bloomer, MD  betamethasone dipropionate (DIPROLENE) 0.05 % ointment Apply topically 2 (two) times daily. To affected nails 05/18/20  Yes Chambliss, Jeb Levering, MD  famotidine (PEPCID) 20 MG tablet Take 1 tablet (20 mg total) by mouth daily. 12/31/21  Yes Milus Banister, MD  VITAMIN D PO Take 2,000 Units by mouth daily.   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   Gastroesophageal reflux disease 09/30/2018   History of celiac disease 06/16/2018   Acute cough 05/01/2017   Psoriasis of nail    CELIAC SPRUE 10/30/2010    Past Medical History:  Diagnosis Date   Allergy    SEASONAL   Asthma    AS CHILD   Celiac sprue    GERD (gastroesophageal reflux disease)    Psoriasis    Vitamin D deficiency    Wears dentures    upper only    Past  Surgical History:  Procedure Laterality Date   COLONOSCOPY  07/06/2021   HERNIA REPAIR     KNEE ARTHROSCOPY  1985   LEFT   PELVIC LAPAROSCOPY  12/2010   To remove scar tissue   TUBAL LIGATION  4580   UMBILICAL HERNIA REPAIR  12/2008   UPPER GASTROINTESTINAL ENDOSCOPY  07/06/2021   UPPER GI ENDOSCOPY  2009   f/u celiac     Social History   Socioeconomic History   Marital status: Married    Spouse name: Not on file   Number of children: Not on file   Years of education: Not on file   Highest education level: Not on file  Occupational History   Not on file  Tobacco Use   Smoking status: Every Day    Packs/day: 0.75    Years: 42.00    Total pack years: 31.50    Types: Cigarettes    Start date: 64   Smokeless tobacco: Never   Tobacco comments:    smokes 1/2 pack cigarettes per day  Substance and Sexual Activity   Alcohol use: Yes    Alcohol/week: 5.0 standard drinks of alcohol    Types: 5 Shots of liquor per week   Drug use: No   Sexual activity: Yes    Partners: Male    Birth control/protection: Surgical, Post-menopausal    Comment: BTL  Other Topics Concern  Not on file  Social History Narrative   Not on file   Social Determinants of Health   Financial Resource Strain: Not on file  Food Insecurity: Not on file  Transportation Needs: Not on file  Physical Activity: Not on file  Stress: Not on file  Social Connections: Not on file  Intimate Partner Violence: Not on file    Family History  Problem Relation Age of Onset   Diabetes Mother    Hypertension Mother    Hyperlipidemia Mother    Cancer Father        LUNG   Colon polyps Father    Healthy Sister    Healthy Brother    Heart disease Maternal Grandmother    Cancer Paternal Grandfather    Healthy Brother    Healthy Brother    Colon cancer Neg Hx    Esophageal cancer Neg Hx    Stomach cancer Neg Hx    Rectal cancer Neg Hx      Review of Systems  Constitutional: Negative.  Negative for  chills and fever.  HENT:  Positive for congestion.   Respiratory:  Positive for cough, sputum production and wheezing. Negative for shortness of breath.   Cardiovascular: Negative.  Negative for chest pain and palpitations.  Gastrointestinal: Negative.  Negative for abdominal pain, diarrhea, nausea and vomiting.  Genitourinary: Negative.  Negative for dysuria.  Skin: Negative.  Negative for rash.  Neurological: Negative.  Negative for dizziness and headaches.  All other systems reviewed and are negative.  Today's Vitals   09/23/22 1305  BP: 120/62  Pulse: 80  Temp: 98.3 F (36.8 C)  TempSrc: Oral  SpO2: 98%  Weight: 135 lb (61.2 kg)  Height: 5' 4"  (1.626 m)   Body mass index is 23.17 kg/m.   Physical Exam Vitals reviewed.  Constitutional:      Appearance: Normal appearance.  HENT:     Head: Normocephalic.     Mouth/Throat:     Mouth: Mucous membranes are moist.     Pharynx: Oropharynx is clear.  Eyes:     Extraocular Movements: Extraocular movements intact.     Conjunctiva/sclera: Conjunctivae normal.     Pupils: Pupils are equal, round, and reactive to light.  Cardiovascular:     Rate and Rhythm: Normal rate and regular rhythm.     Pulses: Normal pulses.     Heart sounds: Normal heart sounds.  Pulmonary:     Effort: Pulmonary effort is normal.     Breath sounds: Wheezing present.  Musculoskeletal:     Cervical back: No tenderness.     Right lower leg: No edema.     Left lower leg: No edema.  Lymphadenopathy:     Cervical: No cervical adenopathy.  Skin:    General: Skin is warm and dry.  Neurological:     General: No focal deficit present.     Mental Status: She is alert and oriented to person, place, and time.  Psychiatric:        Mood and Affect: Mood normal.        Behavior: Behavior normal.    DG Chest 2 View  Result Date: 09/23/2022 CLINICAL DATA:  Cough and LOWER respiratory infection. EXAM: CHEST - 2 VIEW COMPARISON:  11/29/2019 FINDINGS: Lungs  are well inflated. Heart size is normal. No focal consolidations or pleural effusions. No pulmonary edema. Mild midthoracic spondylosis. IMPRESSION: No active cardiopulmonary disease. Electronically Signed   By: Nolon Nations M.D.   On: 09/23/2022 13:43  ASSESSMENT & PLAN: Problem List Items Addressed This Visit       Respiratory   Lower respiratory infection    Acute bronchitis.  Chest x-ray done today. May benefit from azithromycin daily for 5 days. Chronic smoker with productive cough and bronchospasm on physical examination.      Relevant Medications   azithromycin (ZITHROMAX) 250 MG tablet   Other Relevant Orders   DG Chest 2 View     Other   Acute cough - Primary    Take over-the-counter Mucinex DM.  Start Tessalon 200 mg 3 times daily along with cough drops. Advised to stay well-hydrated and rest.      Relevant Medications   predniSONE (DELTASONE) 20 MG tablet   benzonatate (TESSALON) 200 MG capsule   Other Relevant Orders   DG Chest 2 View   Current smoker    Cardiovascular and cancer risk associated with smoking discussed.  Smoking cessation advised given.      Wheezing    Chronic smoker. We will start prednisone 40 mg daily and albuterol 2 puffs twice a day for 5 days      Relevant Medications   albuterol (VENTOLIN HFA) 108 (90 Base) MCG/ACT inhaler   predniSONE (DELTASONE) 20 MG tablet   Patient Instructions  Cough, Adult A cough helps to clear your throat and lungs. A cough may be a sign of an illness or another medical condition. An acute cough may only last 2-3 weeks, while a chronic cough may last 8 or more weeks. Many things can cause a cough. They include: Germs (viruses or bacteria) that attack the airway. Breathing in things that bother (irritate) your lungs. Allergies. Asthma. Mucus that runs down the back of your throat (postnasal drip). Smoking. Acid backing up from the stomach into the tube that moves food from the mouth to the  stomach (gastroesophageal reflux). Some medicines. Lung problems. Other medical conditions, such as heart failure or a blood clot in the lung (pulmonary embolism). Follow these instructions at home: Medicines Take over-the-counter and prescription medicines only as told by your doctor. Talk with your doctor before you take medicines that stop a cough (cough suppressants). Lifestyle  Do not smoke, and try not to be around smoke. Do not use any products that contain nicotine or tobacco, such as cigarettes, e-cigarettes, and chewing tobacco. If you need help quitting, ask your doctor. Drink enough fluid to keep your pee (urine) pale yellow. Avoid caffeine. Do not drink alcohol if your doctor tells you not to drink. General instructions  Watch for any changes in your cough. Tell your doctor about them. Always cover your mouth when you cough. Stay away from things that make you cough, such as perfume, candles, campfire smoke, or cleaning products. If the air is dry, use a cool mist vaporizer or humidifier in your home. If your cough is worse at night, try using extra pillows to raise your head up higher while you sleep. Rest as needed. Keep all follow-up visits as told by your doctor. This is important. Contact a doctor if: You have new symptoms. You cough up pus. Your cough does not get better after 2-3 weeks, or your cough gets worse. Cough medicine does not help your cough and you are not sleeping well. You have pain that gets worse or pain that is not helped with medicine. You have a fever. You are losing weight and you do not know why. You have night sweats. Get help right away if: You cough up  blood. You have trouble breathing. Your heartbeat is very fast. These symptoms may be an emergency. Do not wait to see if the symptoms will go away. Get medical help right away. Call your local emergency services (911 in the [place] Do not drive yourself to the hospital. Summary A cough  helps to clear your throat and lungs. Many things can cause a cough. Take over-the-counter and prescription medicines only as told by your doctor. Always cover your mouth when you cough. Contact a doctor if you have new symptoms or you have a cough that does not get better or gets worse. This information is not intended to replace advice given to you by your health care provider. Make sure you discuss any questions you have with your health care provider. Document Revised: 01/28/2020 Document Reviewed: 12/28/2018 Elsevier Patient Education  [place], MD [facility]

## 2022-09-23 NOTE — Assessment & Plan Note (Addendum)
Acute bronchitis.  Chest x-ray done today. May benefit from azithromycin daily for 5 days. Chronic smoker with productive cough and bronchospasm on physical examination.

## 2022-09-23 NOTE — Telephone Encounter (Signed)
This encounter was created in error - please disregard.

## 2022-09-23 NOTE — Assessment & Plan Note (Signed)
Cardiovascular and cancer risk associated with smoking discussed.  Smoking cessation advised given.

## 2022-09-23 NOTE — Assessment & Plan Note (Signed)
Chronic smoker. We will start prednisone 40 mg daily and albuterol 2 puffs twice a day for 5 days

## 2022-09-23 NOTE — Assessment & Plan Note (Signed)
Take over-the-counter Mucinex DM.  Start Tessalon 200 mg 3 times daily along with cough drops. Advised to stay well-hydrated and rest.

## 2022-09-23 NOTE — Patient Instructions (Signed)
Cough, Adult A cough helps to clear your throat and lungs. A cough may be a sign of an illness or another medical condition. An acute cough may only last 2-3 weeks, while a chronic cough may last 8 or more weeks. Many things can cause a cough. They include: Germs (viruses or bacteria) that attack the airway. Breathing in things that bother (irritate) your lungs. Allergies. Asthma. Mucus that runs down the back of your throat (postnasal drip). Smoking. Acid backing up from the stomach into the tube that moves food from the mouth to the stomach (gastroesophageal reflux). Some medicines. Lung problems. Other medical conditions, such as heart failure or a blood clot in the lung (pulmonary embolism). Follow these instructions at home: Medicines Take over-the-counter and prescription medicines only as told by your doctor. Talk with your doctor before you take medicines that stop a cough (cough suppressants). Lifestyle  Do not smoke, and try not to be around smoke. Do not use any products that contain nicotine or tobacco, such as cigarettes, e-cigarettes, and chewing tobacco. If you need help quitting, ask your doctor. Drink enough fluid to keep your pee (urine) pale yellow. Avoid caffeine. Do not drink alcohol if your doctor tells you not to drink. General instructions  Watch for any changes in your cough. Tell your doctor about them. Always cover your mouth when you cough. Stay away from things that make you cough, such as perfume, candles, campfire smoke, or cleaning products. If the air is dry, use a cool mist vaporizer or humidifier in your home. If your cough is worse at night, try using extra pillows to raise your head up higher while you sleep. Rest as needed. Keep all follow-up visits as told by your doctor. This is important. Contact a doctor if: You have new symptoms. You cough up pus. Your cough does not get better after 2-3 weeks, or your cough gets worse. Cough medicine  does not help your cough and you are not sleeping well. You have pain that gets worse or pain that is not helped with medicine. You have a fever. You are losing weight and you do not know why. You have night sweats. Get help right away if: You cough up blood. You have trouble breathing. Your heartbeat is very fast. These symptoms may be an emergency. Do not wait to see if the symptoms will go away. Get medical help right away. Call your local emergency services (911 in the [place] Do not drive yourself to the hospital. Summary A cough helps to clear your throat and lungs. Many things can cause a cough. Take over-the-counter and prescription medicines only as told by your doctor. Always cover your mouth when you cough. Contact a doctor if you have new symptoms or you have a cough that does not get better or gets worse. This information is not intended to replace advice given to you by your health care provider. Make sure you discuss any questions you have with your health care provider. Document Revised: 01/28/2020 Document Reviewed: 12/28/2018 Elsevier Patient Education  [place].

## 2022-10-30 IMAGING — MG MM DIGITAL SCREENING BILAT W/ TOMO AND CAD
8 series · 8 of 24 positions shown · non-contrast
Comparison: Previous exam(s).

CLINICAL DATA: Screening.

EXAM:
DIGITAL SCREENING BILATERAL MAMMOGRAM WITH TOMOSYNTHESIS AND CAD
TECHNIQUE: Bilateral screening digital craniocaudal and mediolateral oblique
mammograms were obtained. Bilateral screening digital breast
tomosynthesis was performed. The images were evaluated with
computer-aided detection.

[R CC synth-2D]
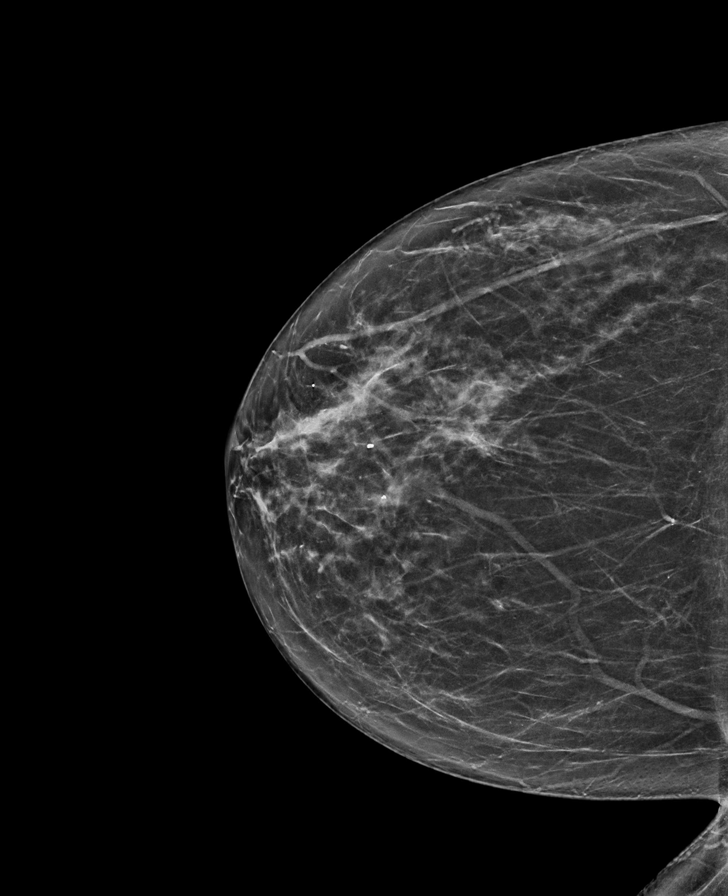

[L CC synth-2D]
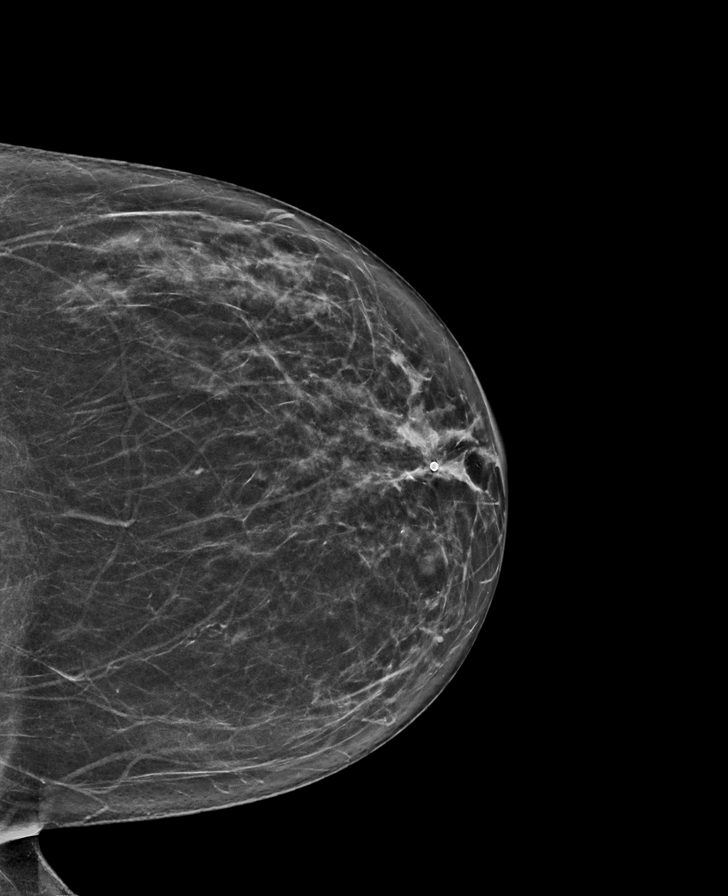

[L MLO synth-2D]
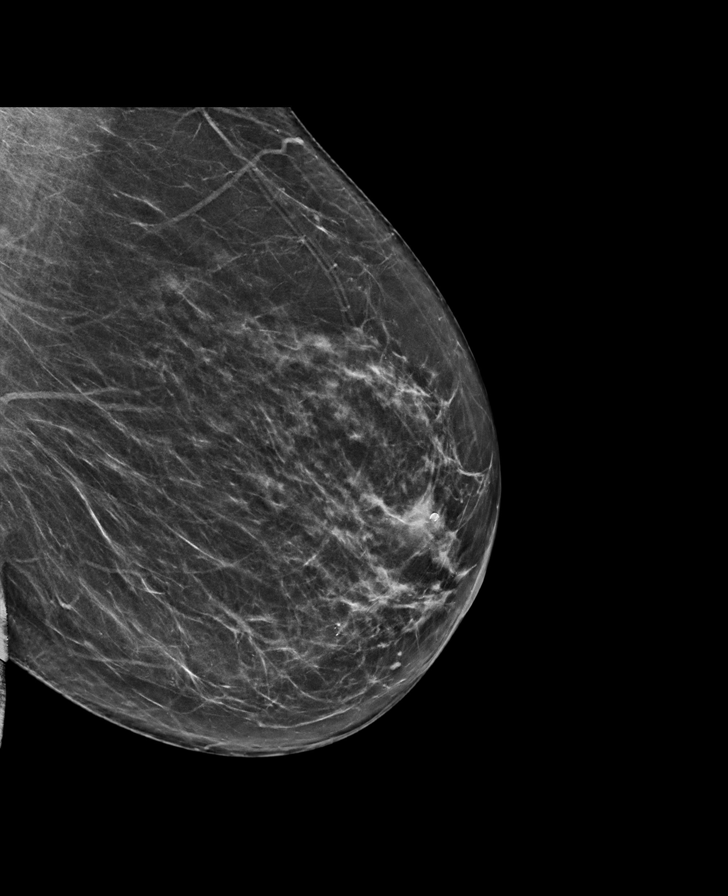

[R MLO synth-2D]
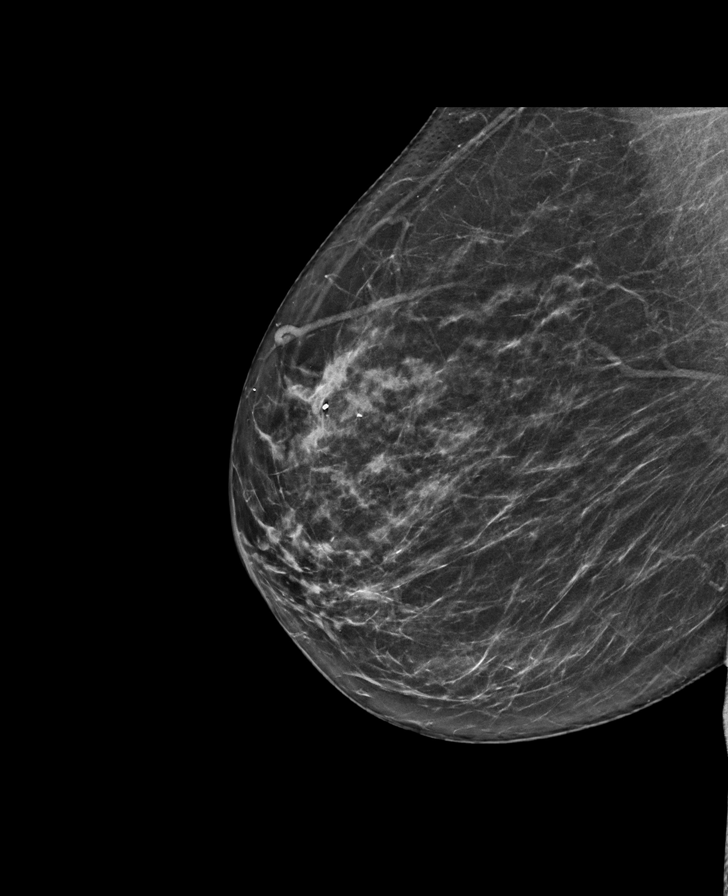

[R CC tomo · tomo slice 34/67.0]
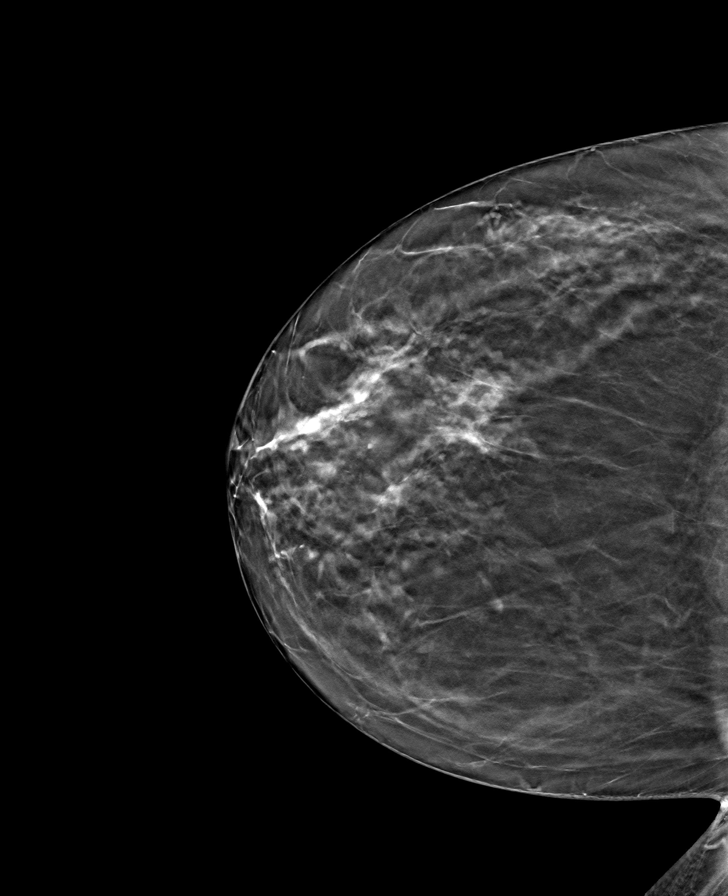

[R MLO tomo · tomo slice 36/71.0]
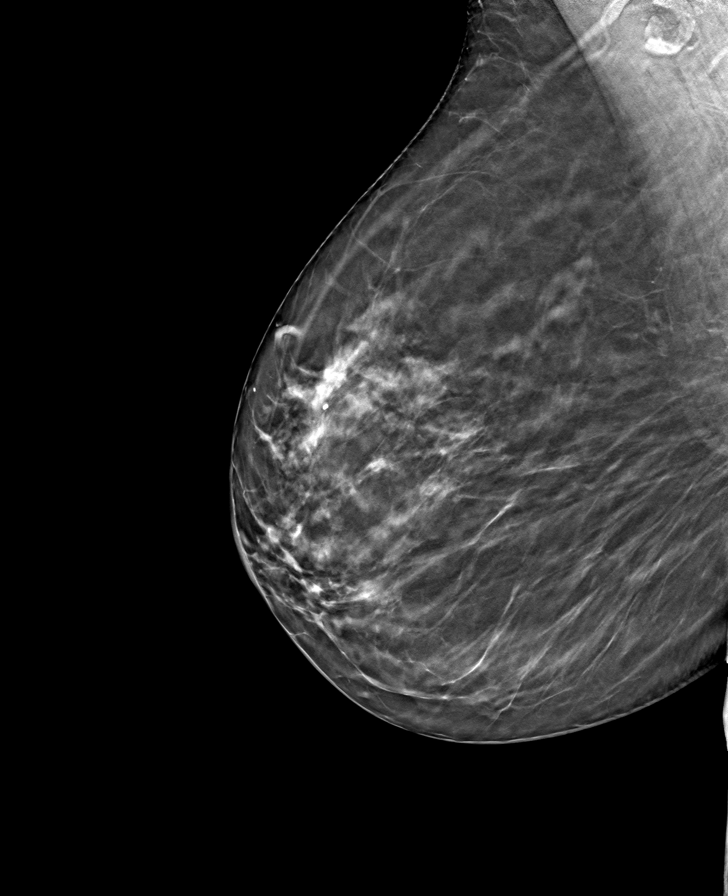

[L MLO tomo · tomo slice 37/72.0]
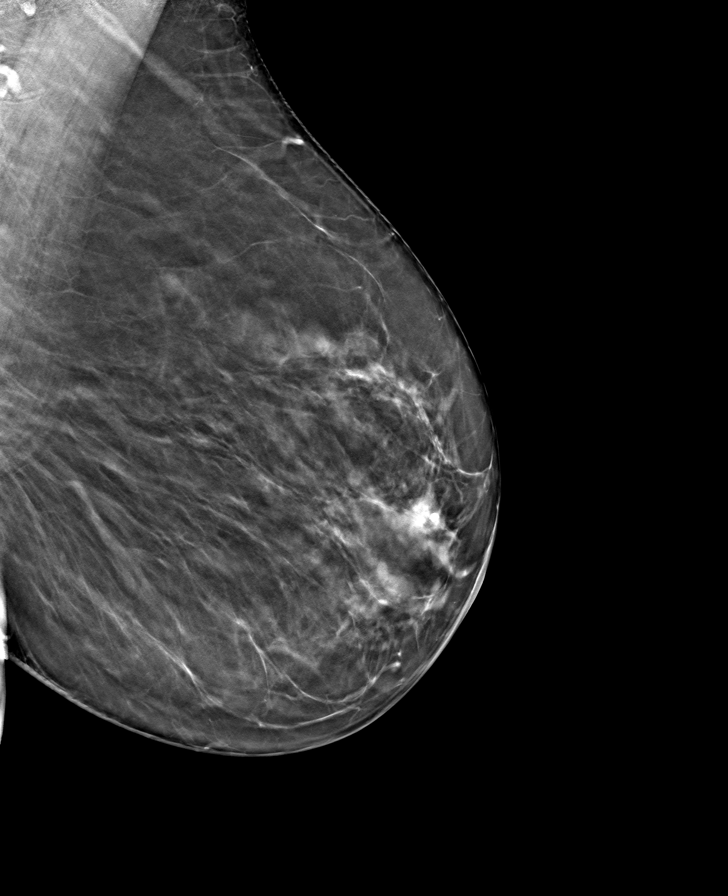

[L CC tomo · tomo slice 33/66.0]
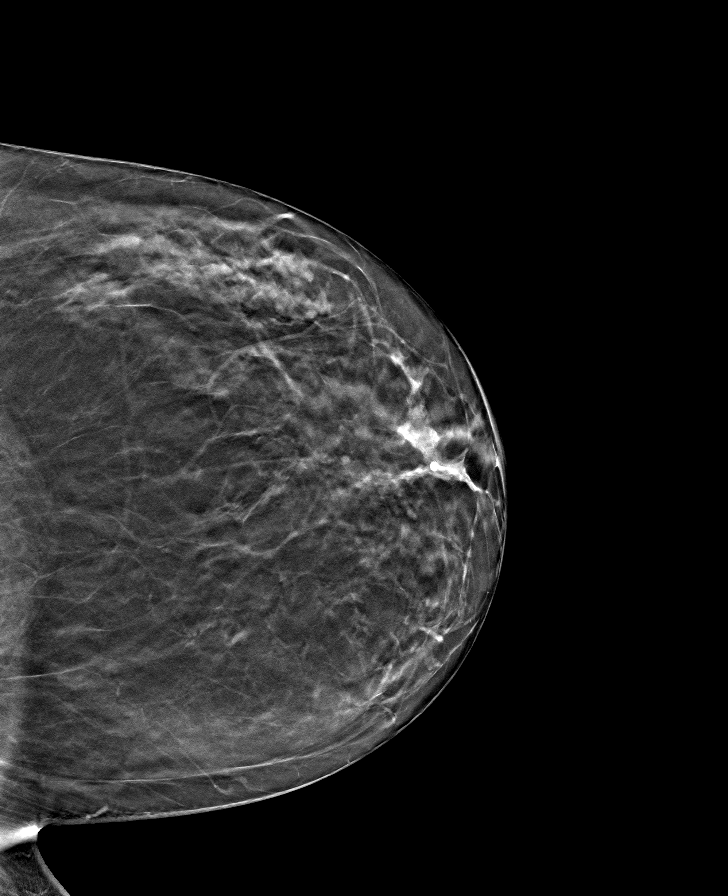

[8 of 24 positions shown; findings below may reference images not displayed]

ACR Breast Density Category b: There are scattered areas of
fibroglandular density.
FINDINGS: There are no findings suspicious for malignancy.
IMPRESSION: No mammographic evidence of malignancy. A result letter of this
screening mammogram will be mailed directly to the patient.

RECOMMENDATION:
Screening mammogram in one year. (Code:51-O-LD2)

BI-RADS CATEGORY  1: Negative.

## 2022-12-06 ENCOUNTER — Encounter (HOSPITAL_COMMUNITY): Payer: Self-pay | Admitting: Emergency Medicine

## 2022-12-06 ENCOUNTER — Ambulatory Visit (INDEPENDENT_AMBULATORY_CARE_PROVIDER_SITE_OTHER): Payer: BC Managed Care – PPO

## 2022-12-06 ENCOUNTER — Ambulatory Visit (HOSPITAL_COMMUNITY)
Admission: EM | Admit: 2022-12-06 | Discharge: 2022-12-06 | Disposition: A | Discharge status: Home / Self Care | Payer: BC Managed Care – PPO | Attending: Internal Medicine | Admitting: Internal Medicine

## 2022-12-06 DIAGNOSIS — S2242XA Multiple fractures of ribs, left side, initial encounter for closed fracture: Secondary | ICD-10-CM | POA: Diagnosis not present

## 2022-12-06 DIAGNOSIS — S2232XA Fracture of one rib, left side, initial encounter for closed fracture: Secondary | ICD-10-CM

## 2022-12-06 DIAGNOSIS — W19XXXA Unspecified fall, initial encounter: Secondary | ICD-10-CM

## 2022-12-06 DIAGNOSIS — J984 Other disorders of lung: Secondary | ICD-10-CM | POA: Diagnosis not present

## 2022-12-06 MED ORDER — TRAMADOL HCL 50 MG PO TABS: 50.0000 mg | ORAL_TABLET | Freq: Four times a day (QID) | ORAL | 0 refills | Status: DC | PRN

## 2022-12-06 NOTE — ED Triage Notes (Signed)
Pt reports that fell last Friday and hit left ribs. Pain is getting worse esp when coughs. Took tylenol

## 2022-12-06 NOTE — Discharge Instructions (Addendum)
Your x-ray demonstrates a rib fracture on the left side Deep breathing exercises recommended Take pain medications as recommended If you have worsening symptoms please return to urgent care to be reevaluated.

## 2022-12-09 NOTE — ED Provider Notes (Signed)
[facility]    CSN: [id number] Arrival date & time: 12/06/22  1625      History   Chief Complaint Chief Complaint  Patient presents with   Fall   Rib Injury    HPI Victoria Mann is a 61 y.o. female comes to the urgent care with left-sided chest pain which happened after she fell about a week ago.  Patient lost her balance and fell.  Following the fall she started experiencing left-sided chest pain.  The pain was mild initially and has worsened over the past few days.  Pain is aggravated by taking a deep breath and on palpating the left chest wall.  No shortness of breath or wheezing.  No fever or chills.  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   Gastroesophageal reflux disease 09/30/2018   History of celiac disease 06/16/2018   Acute cough 05/01/2017   Psoriasis of nail    CELIAC SPRUE 10/30/2010    Past Surgical History:  Procedure Laterality Date   COLONOSCOPY  07/06/2021   HERNIA REPAIR     KNEE ARTHROSCOPY  1985   LEFT   PELVIC LAPAROSCOPY  12/2010   To remove scar tissue   TUBAL LIGATION  6294   UMBILICAL HERNIA REPAIR  12/2008   UPPER GASTROINTESTINAL ENDOSCOPY  07/06/2021   UPPER GI ENDOSCOPY  2009   f/u celiac     OB History     Gravida  4   Para  2   Term      Preterm      AB  2   Living  2      SAB      IAB      Ectopic      Multiple      Live Births               Home Medications    Prior to Admission medications   Medication Sig Start Date End  Date Taking? Authorizing Provider  traMADol (ULTRAM) 50 MG tablet Take 1 tablet (50 mg total) by mouth every 6 (six) hours as needed. 12/06/22  Yes Karver Fadden, Myrene Galas, MD  albuterol (VENTOLIN HFA) 108 (90 Base) MCG/ACT inhaler Inhale 2 puffs into the lungs every 6 (six) hours as needed for wheezing or shortness of breath. 09/23/22   Horald Pollen, MD  VITAMIN D PO Take 2,000 Units by mouth daily.    [provider]    Family History Family History  Problem Relation Age of Onset   Diabetes Mother    Hypertension Mother    Hyperlipidemia Mother    Cancer Father        LUNG   Colon polyps Father    Healthy Sister    Healthy Brother    Heart disease Maternal Grandmother    Cancer Paternal Grandfather    Healthy Brother    Healthy Brother    Colon cancer  Neg Hx    Esophageal cancer Neg Hx    Stomach cancer Neg Hx    Rectal cancer Neg Hx     Social History Social History   Tobacco Use   Smoking status: Every Day    Packs/day: 0.75    Years: 42.00    Total pack years: 31.50    Types: Cigarettes    Start date: 85   Smokeless tobacco: Never   Tobacco comments:    smokes 1/2 pack cigarettes per day  Substance Use Topics   Alcohol use: Yes    Alcohol/week: 5.0 standard drinks of alcohol    Types: 5 Shots of liquor per week   Drug use: No     Allergies   Lansoprazole, Omeprazole, and Nexium [esomeprazole magnesium]   Review of Systems Review of Systems  Respiratory:  Negative for cough, chest tightness, shortness of breath and wheezing.   Cardiovascular:  Positive for chest pain.  Gastrointestinal: Negative.   Genitourinary: Negative.   Musculoskeletal:  Positive for myalgias.  Skin: Negative.      Physical Exam Triage Vital Signs ED Triage Vitals  Enc Vitals Group     BP 12/06/22 1758 (!) 162/63     Pulse Rate 12/06/22 1758 78     Resp 12/06/22 1758 17     Temp 12/06/22 1758 98.3 F (36.8 C)     Temp Source 12/06/22 1758 Oral     SpO2  12/06/22 1758 95 %     Weight --      Height --      Head Circumference --      Peak Flow --      Pain Score 12/06/22 1756 8     Pain Loc --      Pain Edu? --      Excl. in [place]? --    No data found.  Updated Vital Signs BP (!) 162/63 (BP Location: Right Arm)   Pulse 78   Temp 98.3 F (36.8 C) (Oral)   Resp 17   LMP 09/24/2013   SpO2 95%   Visual Acuity Right Eye Distance:   Left Eye Distance:   Bilateral Distance:    Right Eye Near:   Left Eye Near:    Bilateral Near:     Physical Exam Vitals and nursing note reviewed.  Constitutional:      General: She is not in acute distress.    Appearance: She is not ill-appearing.  Cardiovascular:     Rate and Rhythm: Normal rate and regular rhythm.     Pulses: Normal pulses.     Heart sounds: Normal heart sounds.  Pulmonary:     Effort: Pulmonary effort is normal.     Breath sounds: No wheezing or rhonchi.  Abdominal:     General: Bowel sounds are normal.     Palpations: Abdomen is soft.  Neurological:     Mental Status: She is alert.      UC Treatments / Results  Labs (all labs ordered are listed, but only abnormal results are displayed) Labs Reviewed - No data to display  EKG   Radiology No results found.  Procedures Procedures (including critical care time)  Medications Ordered in UC Medications - No data to display  Initial Impression / Assessment and Plan / UC Course  I have reviewed the triage vital signs and the nursing notes.  Pertinent labs & imaging results that were available during my care of the patient were reviewed by me and considered  in my medical decision making (see chart for details).     1 1.  Closed fracture of the left side: Chest x-ray is significant for minimally displaced fracture of the left sixth rib Tramadol 50 mg every 6 hours as needed for chest pain Deep breathing exercises recommended Return precautions given. Final Clinical Impressions(s) / UC Diagnoses   Final  diagnoses:  Closed fracture of one rib of left side, initial encounter     Discharge Instructions      Your x-ray demonstrates a rib fracture on the left side Deep breathing exercises recommended Take pain medications as recommended If you have worsening symptoms please return to urgent care to be reevaluated.   ED Prescriptions     Medication Sig Dispense Auth. Provider   traMADol (ULTRAM) 50 MG tablet Take 1 tablet (50 mg total) by mouth every 6 (six) hours as needed. 15 tablet Shammond Arave, Myrene Galas, MD      I have reviewed the PDMP during this encounter.   Chase Picket, MD 12/09/22 [id number]

## 2022-12-12 ENCOUNTER — Ambulatory Visit: Payer: BC Managed Care – PPO | Admitting: Emergency Medicine

## 2022-12-30 ENCOUNTER — Encounter: Payer: Self-pay | Admitting: Emergency Medicine

## 2022-12-30 ENCOUNTER — Telehealth: Payer: BC Managed Care – PPO | Admitting: Emergency Medicine

## 2022-12-30 DIAGNOSIS — J22 Unspecified acute lower respiratory infection: Secondary | ICD-10-CM

## 2022-12-30 DIAGNOSIS — R0981 Nasal congestion: Secondary | ICD-10-CM | POA: Diagnosis not present

## 2022-12-30 DIAGNOSIS — F17200 Nicotine dependence, unspecified, uncomplicated: Secondary | ICD-10-CM

## 2022-12-30 DIAGNOSIS — R051 Acute cough: Secondary | ICD-10-CM | POA: Diagnosis not present

## 2022-12-30 MED ORDER — BENZONATATE 200 MG PO CAPS: 200.0000 mg | ORAL_CAPSULE | Freq: Two times a day (BID) | ORAL | 0 refills | Status: DC | PRN

## 2022-12-30 MED ORDER — HYDROCODONE BIT-HOMATROP MBR 5-1.5 MG/5ML PO SOLN: 5.0000 mL | Freq: Every evening | ORAL | 0 refills | Status: DC | PRN

## 2022-12-30 MED ORDER — AMOXICILLIN-POT CLAVULANATE 875-125 MG PO TABS: 1.0000 | ORAL_TABLET | Freq: Two times a day (BID) | ORAL | 0 refills | Status: AC

## 2022-12-30 NOTE — Assessment & Plan Note (Signed)
Cardiovascular/cancer risk associated with smoking discussed. Smoking cessation advice given

## 2022-12-30 NOTE — Assessment & Plan Note (Signed)
Continue over-the-counter Mucinex DM. Advised to stay well-hydrated and decrease smoking

## 2022-12-30 NOTE — Assessment & Plan Note (Signed)
Viral respiratory infection now with secondary bacterial infection.  Will benefit from Augmentin 875 mg twice a day for 7 days. No symptoms of pneumonia. Cough affecting quality of life. Advised to contact the office if no better or worse during the next several days. Advised to decrease amount of smoking.

## 2022-12-30 NOTE — Progress Notes (Signed)
Telemedicine Encounter- SOAP NOTE Established Patient MyChart video encounter Patient: Home  Provider: Office   Patient present only  This video encounter was conducted with the patient's (or proxy's) verbal consent via video telecommunications: yes/no: Yes Patient was instructed to have this encounter in a suitably private space; and to only have persons present to whom they give permission to participate. In addition, patient identity was confirmed by use of name plus two identifiers (DOB and address).  I discussed the limitations, risks, security and privacy concerns of performing an evaluation and management service by telephone and the availability of in person appointments. I also discussed with the patient that there may be a patient responsible charge related to this service. The patient expressed understanding and agreed to proceed.  I spent a total of TIME; 0 MIN TO 60 MIN: 25 minutes talking with the patient or their proxy.  Chief complaint: Productive cough  Subjective   Victoria Mann is a 62 y.o. female established patient. Telephone visit today complaining of flulike symptoms for the past several weeks progressively getting worse. Chronic smoker.  Productive cough with sinus congestion and "ears popping".  Denies difficulty breathing or chest pain.  Able to eat and drink.  Denies nausea or vomiting.  Denies abdominal pain or diarrhea.  No other associated symptoms.  Over-the-counter medication not helping much. No other complaints or medical concerns today.  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 1 each 3   traMADol (ULTRAM) 50 MG tablet Take 1 tablet (50 mg total) by mouth every 6 (six) hours as needed. 15 tablet 0   VITAMIN D PO Take 2,000 Units by mouth daily.     No current facility-administered medications for this visit.    Allergies  Allergen Reactions   Lansoprazole Hives   Omeprazole Hives    REACTION: rash   Nexium [Esomeprazole Magnesium] Hives    Social History   Socioeconomic History   Marital status: Married    Spouse name: Not on file   Number of children: Not on file   Years of education: Not on file   Highest education level: Not on file  Occupational History   Not on file  Tobacco Use   Smoking status: Every Day    Packs/day: 0.75    Years: 42.00    Total pack years: 31.50    Types: Cigarettes    Start date: 21   Smokeless tobacco: Never   Tobacco comments:    smokes 1/2 pack cigarettes per day  Substance and Sexual Activity   Alcohol  use: Yes    Alcohol/week: 5.0 standard drinks of alcohol    Types: 5 Shots of liquor per week   Drug use: No   Sexual activity: Yes    Partners: Male    Birth control/protection: Surgical, Post-menopausal    Comment: BTL  Other Topics Concern   Not on file  Social History Narrative   Not on file   Social Determinants of Health   Financial Resource Strain: Not on file  Food Insecurity: Not on file  Transportation Needs: Not on file  Physical Activity: Not on file  Stress: Not on file  Social Connections: Not on file  Intimate Partner Violence: Not on file     Review of Systems  Constitutional: Negative.  Negative for chills and fever.  HENT:  Positive for congestion. Negative for sore throat.   Respiratory:  Positive for cough. Negative for shortness of breath.   Cardiovascular: Negative.  Negative for chest pain and palpitations.  Gastrointestinal:  Negative for abdominal pain, nausea and vomiting.  Genitourinary: Negative.  Negative for dysuria and hematuria.  Skin: Negative.  Negative for rash.  Neurological: Negative.  Negative for dizziness and headaches.    Objective  Alert and oriented x 3 in no apparent respiratory distress Vitals as reported by the patient: There were no vitals filed for this visit.  Problem List Items Addressed This Visit       Respiratory   Lower respiratory infection - Primary    Viral respiratory infection now with secondary bacterial infection.  Will benefit from Augmentin 875 mg twice a day for 7 days. No symptoms of pneumonia. Cough affecting quality of life. Advised to contact the office if no better or worse during the next several days. Advised to decrease amount of smoking.      Relevant Medications   amoxicillin-clavulanate (AUGMENTIN) 875-125 MG tablet   Sinus congestion    Continue over-the-counter Mucinex DM. Advised to stay well-hydrated and decrease smoking        Other   Acute cough    Continue over-the-counter Mucinex DM and cough drops Advised to stay well-hydrated Start Tessalon 200 mg 3 times a day and Hycodan syrup at bedtime      Relevant Medications   benzonatate (TESSALON) 200 MG capsule   HYDROcodone bit-homatropine (HYCODAN) 5-1.5 MG/5ML syrup   Current smoker    Cardiovascular/cancer risk associated with smoking discussed. Smoking cessation advice given        I discussed the assessment and treatment plan with the patient. The patient was provided an opportunity to ask questions and all were answered. The patient agreed with the plan and demonstrated an  understanding of the instructions.   The patient was advised to call back or seek an in-person evaluation if the symptoms worsen or if the condition fails to improve as anticipated.  I provided 35 minutes of non-face-to-face time during this encounter including review of most recent office visit notes, review of past medical history and past chronic medical conditions, smoking cessation advised, diagnosis of lower respiratory infection and need for antibiotics, possible sinusitis, cough management, prognosis, documentation and need for follow-up  Horald Pollen, MD  Primary Care at [facility]

## 2022-12-30 NOTE — Assessment & Plan Note (Signed)
Continue over-the-counter Mucinex DM and cough drops Advised to stay well-hydrated Start Tessalon 200 mg 3 times a day and Hycodan syrup at bedtime

## 2023-07-21 ENCOUNTER — Encounter: Payer: Self-pay | Admitting: Emergency Medicine

## 2023-07-21 ENCOUNTER — Ambulatory Visit (INDEPENDENT_AMBULATORY_CARE_PROVIDER_SITE_OTHER): Payer: Commercial Managed Care - PPO | Admitting: Emergency Medicine

## 2023-07-21 ENCOUNTER — Other Ambulatory Visit: Payer: Self-pay | Admitting: Emergency Medicine

## 2023-07-21 VITALS — BP 124/68 | HR 75 | Temp 98.2°F | Ht 64.0 in | Wt 142.1 lb

## 2023-07-21 DIAGNOSIS — Z0001 Encounter for general adult medical examination with abnormal findings: Secondary | ICD-10-CM

## 2023-07-21 DIAGNOSIS — Z1329 Encounter for screening for other suspected endocrine disorder: Secondary | ICD-10-CM | POA: Diagnosis not present

## 2023-07-21 DIAGNOSIS — Z13220 Encounter for screening for lipoid disorders: Secondary | ICD-10-CM

## 2023-07-21 DIAGNOSIS — Z130 Encounter for screening for diseases of the blood and blood-forming organs and certain disorders involving the immune mechanism: Secondary | ICD-10-CM | POA: Diagnosis not present

## 2023-07-21 DIAGNOSIS — Z23 Encounter for immunization: Secondary | ICD-10-CM

## 2023-07-21 DIAGNOSIS — K900 Celiac disease: Secondary | ICD-10-CM

## 2023-07-21 DIAGNOSIS — F17200 Nicotine dependence, unspecified, uncomplicated: Secondary | ICD-10-CM

## 2023-07-21 DIAGNOSIS — K219 Gastro-esophageal reflux disease without esophagitis: Secondary | ICD-10-CM

## 2023-07-21 DIAGNOSIS — Z13228 Encounter for screening for other metabolic disorders: Secondary | ICD-10-CM | POA: Diagnosis not present

## 2023-07-21 DIAGNOSIS — L409 Psoriasis, unspecified: Secondary | ICD-10-CM

## 2023-07-21 DIAGNOSIS — Z13820 Encounter for screening for osteoporosis: Secondary | ICD-10-CM

## 2023-07-21 DIAGNOSIS — L603 Nail dystrophy: Secondary | ICD-10-CM

## 2023-07-21 LAB — CBC WITH DIFFERENTIAL/PLATELET
Basophils Absolute: 0 10*3/uL (ref 0.0–0.1)
Basophils Relative: 0.4 % (ref 0.0–3.0)
Eosinophils Absolute: 0.1 10*3/uL (ref 0.0–0.7)
Eosinophils Relative: 1.4 % (ref 0.0–5.0)
HCT: 41.2 % (ref 36.0–46.0)
Hemoglobin: 13.9 g/dL (ref 12.0–15.0)
Lymphocytes Relative: 37.1 % (ref 12.0–46.0)
Lymphs Abs: 2.5 10*3/uL (ref 0.7–4.0)
MCHC: 33.7 g/dL (ref 30.0–36.0)
MCV: 100.1 fl — ABNORMAL HIGH (ref 78.0–100.0)
Monocytes Absolute: 0.4 10*3/uL (ref 0.1–1.0)
Monocytes Relative: 5.2 % (ref 3.0–12.0)
Neutro Abs: 3.8 10*3/uL (ref 1.4–7.7)
Neutrophils Relative %: 55.9 % (ref 43.0–77.0)
Platelets: 271 10*3/uL (ref 150.0–400.0)
RBC: 4.12 Mil/uL (ref 3.87–5.11)
RDW: 12.7 % (ref 11.5–15.5)
WBC: 6.8 10*3/uL (ref 4.0–10.5)

## 2023-07-21 LAB — COMPREHENSIVE METABOLIC PANEL
ALT: 13 U/L (ref 0–35)
AST: 18 U/L (ref 0–37)
Albumin: 4.3 g/dL (ref 3.5–5.2)
Alkaline Phosphatase: 72 U/L (ref 39–117)
BUN: 20 mg/dL (ref 6–23)
CO2: 30 mEq/L (ref 19–32)
Calcium: 9.4 mg/dL (ref 8.4–10.5)
Chloride: 101 mEq/L (ref 96–112)
Creatinine, Ser: 0.89 mg/dL (ref 0.40–1.20)
GFR: 69.94 mL/min (ref >60.00)
Glucose, Bld: 101 mg/dL — ABNORMAL HIGH (ref 70–99)
Potassium: 4.1 mEq/L (ref 3.5–5.1)
Sodium: 138 mEq/L (ref 135–145)
Total Bilirubin: 0.3 mg/dL (ref 0.2–1.2)
Total Protein: 6.5 g/dL (ref 6.0–8.3)

## 2023-07-21 LAB — LIPID PANEL
Cholesterol: 224 mg/dL — ABNORMAL HIGH (ref 0–200)
HDL: 70.8 mg/dL (ref >39.00)
NonHDL: 153.25
Total CHOL/HDL Ratio: 3
Triglycerides: 203 mg/dL — ABNORMAL HIGH (ref 0.0–149.0)
VLDL: 40.6 mg/dL — ABNORMAL HIGH (ref 0.0–40.0)

## 2023-07-21 LAB — VITAMIN B12: Vitamin B-12: 251 pg/mL (ref 211–911)

## 2023-07-21 LAB — VITAMIN D 25 HYDROXY (VIT D DEFICIENCY, FRACTURES): VITD: 41.44 ng/mL (ref 30.00–100.00)

## 2023-07-21 LAB — LDL CHOLESTEROL, DIRECT: Direct LDL: 119 mg/dL

## 2023-07-21 LAB — HEMOGLOBIN A1C: Hgb A1c MFr Bld: 5.5 % (ref 4.6–6.5)

## 2023-07-21 MED ORDER — FAMOTIDINE 20 MG PO TABS: 20.0000 mg | ORAL_TABLET | Freq: Every day | ORAL | 3 refills | Status: DC

## 2023-07-21 MED ORDER — VARENICLINE TARTRATE 0.5 MG PO TABS
0.5000 mg | ORAL_TABLET | Freq: Two times a day (BID) | ORAL | 1 refills | Status: DC
Start: 2023-07-21 — End: 2023-07-21

## 2023-07-21 MED ORDER — BETAMETHASONE DIPROPIONATE 0.05 % EX CREA
TOPICAL_CREAM | Freq: Two times a day (BID) | CUTANEOUS | 0 refills | Status: AC
Start: 2023-07-21 — End: ?

## 2023-07-21 NOTE — Assessment & Plan Note (Signed)
Active and affecting quality of life Recommend to restart famotidine 20 mg daily

## 2023-07-21 NOTE — Assessment & Plan Note (Signed)
Stable and well-controlled.  Diet and nutrition discussed.

## 2023-07-21 NOTE — Assessment & Plan Note (Signed)
Cardiovascular and cancer risk associated with smoking discussed Smoking cessation advice given Recommend to start daily Chantix

## 2023-07-21 NOTE — Assessment & Plan Note (Signed)
Betamethasone dipropionate ointment 0.5% works well for her.  New prescription provided.

## 2023-07-21 NOTE — Patient Instructions (Signed)

## 2023-07-21 NOTE — Progress Notes (Signed)
Victoria Mann 62 y.o.   Chief Complaint  Patient presents with   Annual Exam    Patient has several concerns     HISTORY OF PRESENT ILLNESS: This is a 62 y.o. female here for annual exam. Has the following concerns: 1.  Requesting bone density exam.  Recent fall with fracture left foot.  Fall last year with broken rib 2.  Concerns about blood pressure 3.  Chronic smoker.  Inquiring about medication for smoking cessation 4.  History of GERD with small hiatal hernia.  Requesting prescription for famotidine 20 mg 5.  Nail and skin problems.  Requesting prescription for betamethasone ointment 6.  Occasional pain/discomfort to right upper quadrant area mostly in the morning and at times triggered by gagging while brushing her teeth  HPI   Prior to Admission medications   Medication Sig Start Date End Date Taking? Authorizing Provider  albuterol (VENTOLIN HFA) 108 (90 Base) MCG/ACT inhaler Inhale 2 puffs into the lungs every 6 (six) hours as needed for wheezing or shortness of breath. 09/23/22  Yes Jacquan Savas, Eilleen Kempf, MD  betamethasone dipropionate 0.05 % cream Apply topically 2 (two) times daily. 07/21/23  Yes Leatha Rohner, Eilleen Kempf, MD  famotidine (PEPCID) 20 MG tablet Take 1 tablet (20 mg total) by mouth at bedtime. 07/21/23  Yes Janiece Scovill, Eilleen Kempf, MD  varenicline (CHANTIX) 0.5 MG tablet Take 1 tablet (0.5 mg total) by mouth 2 (two) times daily. 07/21/23  Yes SagardiaEilleen Kempf, MD  VITAMIN D PO Take 2,000 Units by mouth daily.   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    Past Medical History:  Diagnosis  Date   Allergy    SEASONAL   Asthma    AS CHILD   Celiac sprue    GERD (gastroesophageal reflux disease)    Psoriasis    Vitamin D deficiency    Wears dentures    upper only    Past Surgical History:  Procedure Laterality Date   COLONOSCOPY  07/06/2021   HERNIA REPAIR     KNEE ARTHROSCOPY  1985   LEFT   PELVIC LAPAROSCOPY  12/2010   To remove scar tissue   TUBAL LIGATION  1993   UMBILICAL HERNIA REPAIR  12/2008   UPPER GASTROINTESTINAL ENDOSCOPY  07/06/2021   UPPER GI ENDOSCOPY  2009   f/u celiac     Social History   Socioeconomic History   Marital status: Married    Spouse name: Not on file   Number of children: Not on file   Years of education: Not on file   Highest education level: Not on file  Occupational History   Not on file  Tobacco Use   Smoking status: Every Day    Current packs/day: 0.75    Average packs/day: 0.8 packs/day for 44.6 years (33.4 ttl pk-yrs)    Types: Cigarettes    Start date: 96   Smokeless tobacco: Never   Tobacco comments:    smokes 1/2 pack cigarettes per day  Substance and Sexual Activity   Alcohol use: Yes    Alcohol/week: 5.0 standard drinks  of alcohol    Types: 5 Shots of liquor per week   Drug use: No   Sexual activity: Yes    Partners: Male    Birth control/protection: Surgical, Post-menopausal    Comment: BTL  Other Topics Concern   Not on file  Social History Narrative   Not on file   Social Determinants of Health   Financial Resource Strain: Not on file  Food Insecurity: Not on file  Transportation Needs: Not on file  Physical Activity: Not on file  Stress: Not on file  Social Connections: Not on file  Intimate Partner Violence: Not on file    Family History  Problem Relation Age of Onset   Diabetes Mother    Hypertension Mother    Hyperlipidemia Mother    Cancer Father        LUNG   Colon polyps Father    Healthy Sister    Healthy Brother    Heart disease Maternal Grandmother    Cancer Paternal  Grandfather    Healthy Brother    Healthy Brother    Colon cancer Neg Hx    Esophageal cancer Neg Hx    Stomach cancer Neg Hx    Rectal cancer Neg Hx      Review of Systems  Constitutional: Negative.  Negative for chills and fever.  HENT: Negative.  Negative for congestion and sore throat.   Respiratory: Negative.  Negative for cough and shortness of breath.   Cardiovascular: Negative.  Negative for chest pain and leg swelling.  Gastrointestinal:  Positive for abdominal pain and heartburn. Negative for nausea and vomiting.  Genitourinary: Negative.  Negative for dysuria and hematuria.  Musculoskeletal: Negative.   Skin: Negative.  Negative for rash.  Neurological: Negative.  Negative for dizziness and headaches.  All other systems reviewed and are negative.   Vitals:   07/21/23 1457  BP: 124/68  Pulse: 75  Temp: 98.2 F (36.8 C)  SpO2: 97%    Physical Exam Vitals reviewed.  Constitutional:      Appearance: Normal appearance.  HENT:     Head: Normocephalic.     Right Ear: Tympanic membrane, ear canal and external ear normal.     Left Ear: Tympanic membrane, ear canal and external ear normal.     Mouth/Throat:     Mouth: Mucous membranes are moist.     Pharynx: Oropharynx is clear.  Eyes:     Extraocular Movements: Extraocular movements intact.     Conjunctiva/sclera: Conjunctivae normal.     Pupils: Pupils are equal, round, and reactive to light.  Cardiovascular:     Rate and Rhythm: Normal rate and regular rhythm.     Pulses: Normal pulses.     Heart sounds: Normal heart sounds.  Pulmonary:     Effort: Pulmonary effort is normal.     Breath sounds: Normal breath sounds.  Abdominal:     Palpations: Abdomen is soft.     Tenderness: There is no abdominal tenderness.     Comments: Possible ventral hernia to right upper quadrant area  Musculoskeletal:        General: Normal range of motion.     Cervical back: No tenderness.  Lymphadenopathy:     Cervical: No  cervical adenopathy.  Skin:    General: Skin is warm and dry.     Capillary Refill: Capillary refill takes less than 2 seconds.     Comments: Mild nail deformities possible psoriatic nails  Neurological:     General:  No focal deficit present.     Mental Status: She is alert and oriented to person, place, and time.  Psychiatric:        Mood and Affect: Mood normal.        Behavior: Behavior normal.      ASSESSMENT & PLAN: Problem List Items Addressed This Visit       Digestive   CELIAC SPRUE    Stable and well-controlled Diet and nutrition discussed      Gastroesophageal reflux disease    Active and affecting quality of life Recommend to restart famotidine 20 mg daily      Relevant Medications   famotidine (PEPCID) 20 MG tablet     Musculoskeletal and Integument   Psoriasis of nail    Betamethasone dipropionate ointment 0.5% works well for her.  New prescription provided.      Relevant Medications   betamethasone dipropionate 0.05 % cream   Nail dystrophy   Relevant Medications   betamethasone dipropionate 0.05 % cream     Other   Current smoker    Cardiovascular and cancer risk associated with smoking discussed Smoking cessation advice given Recommend to start daily Chantix      Relevant Medications   varenicline (CHANTIX) 0.5 MG tablet   Other Relevant Orders   Ambulatory Referral for Lung Cancer Screening [[id number]]   Other Visit Diagnoses     Encounter for general adult medical examination with abnormal findings    -  Primary   Relevant Orders   CBC with Differential   Comprehensive metabolic panel   Hemoglobin A1c   Lipid panel   Need for shingles vaccine       Relevant Orders   Zoster, Recombinant (Shingrix) (Completed)   Screening for deficiency anemia       Relevant Orders   CBC with Differential   Screening for lipoid disorders       Relevant Orders   Lipid panel   Screening for endocrine, metabolic and immunity disorder       Relevant  Orders   Comprehensive metabolic panel   Hemoglobin A1c   Vitamin B12   VITAMIN D 25 Hydroxy (Vit-D Deficiency, Fractures)   Osteoporosis screening       Relevant Orders   DG Bone Density      Modifiable risk factors discussed with patient. Anticipatory guidance according to age provided. The following topics were also discussed: Social Determinants of Health Smoking and cardiovascular/cancer risk associated with smoking discussed Diet and nutrition Benefits of exercise Cancer screening and review of most recent mammogram and colonoscopy reports Vaccinations review and recommendations Cardiovascular risk assessment and need for blood work The 10-year ASCVD risk score (Arnett DK, et al., 2019) is: 6.3%   Values used to calculate the score:     Age: 58 years     Sex: Female     Is Non-Hispanic African American: No     Diabetic: No     Tobacco smoker: Yes     Systolic Blood Pressure: 124 mmHg     Is BP treated: No     HDL Cholesterol: 78 mg/dL     Total Cholesterol: 230 mg/dL  Mental health including depression and anxiety Fall and accident prevention  Patient Instructions  Health Maintenance, Female Adopting a healthy lifestyle and getting preventive care are important in promoting health and wellness. Ask your health care provider about: The right schedule for you to have regular tests and exams. Things you can do on your own to  prevent diseases and keep yourself healthy. What should I know about diet, weight, and exercise? Eat a healthy diet  Eat a diet that includes plenty of vegetables, fruits, low-fat dairy products, and lean protein. Do not eat a lot of foods that are high in solid fats, added sugars, or sodium. Maintain a healthy weight Body mass index (BMI) is used to identify weight problems. It estimates body fat based on height and weight. Your health care provider can help determine your BMI and help you achieve or maintain a healthy weight. Get regular  exercise Get regular exercise. This is one of the most important things you can do for your health. Most adults should: Exercise for at least 150 minutes each week. The exercise should increase your heart rate and make you sweat (moderate-intensity exercise). Do strengthening exercises at least twice a week. This is in addition to the moderate-intensity exercise. Spend less time sitting. Even light physical activity can be beneficial. Watch cholesterol and blood lipids Have your blood tested for lipids and cholesterol at 61 years of age, then have this test every 5 years. Have your cholesterol levels checked more often if: Your lipid or cholesterol levels are high. You are older than 61 years of age. You are at high risk for heart disease. What should I know about cancer screening? Depending on your health history and family history, you may need to have cancer screening at various ages. This may include screening for: Breast cancer. Cervical cancer. Colorectal cancer. Skin cancer. Lung cancer. What should I know about heart disease, diabetes, and high blood pressure? Blood pressure and heart disease High blood pressure causes heart disease and increases the risk of stroke. This is more likely to develop in people who have high blood pressure readings or are overweight. Have your blood pressure checked: Every 3-5 years if you are 8-86 years of age. Every year if you are 46 years old or older. Diabetes Have regular diabetes screenings. This checks your fasting blood sugar level. Have the screening done: Once every three years after age 37 if you are at a normal weight and have a low risk for diabetes. More often and at a younger age if you are overweight or have a high risk for diabetes. What should I know about preventing infection? Hepatitis B If you have a higher risk for hepatitis B, you should be screened for this virus. Talk with your health care provider to find out if you are at  risk for hepatitis B infection. Hepatitis C Testing is recommended for: Everyone born from [birth year] through 1964. Anyone with known risk factors for hepatitis C. Sexually transmitted infections (STIs) Get screened for STIs, including gonorrhea and chlamydia, if: You are sexually active and are younger than 61 years of age. You are older than 61 years of age and your health care provider tells you that you are at risk for this type of infection. Your sexual activity has changed since you were last screened, and you are at increased risk for chlamydia or gonorrhea. Ask your health care provider if you are at risk. Ask your health care provider about whether you are at high risk for HIV. Your health care provider may recommend a prescription medicine to help prevent HIV infection. If you choose to take medicine to prevent HIV, you should first get tested for HIV. You should then be tested every 3 months for as long as you are taking the medicine. Pregnancy If you are about to stop having  your period (premenopausal) and you may become pregnant, seek counseling before you get pregnant. Take 400 to 800 micrograms (mcg) of folic acid every day if you become pregnant. Ask for birth control (contraception) if you want to prevent pregnancy. Osteoporosis and menopause Osteoporosis is a disease in which the bones lose minerals and strength with aging. This can result in bone fractures. If you are 29 years old or older, or if you are at risk for osteoporosis and fractures, ask your health care provider if you should: Be screened for bone loss. Take a calcium or vitamin D supplement to lower your risk of fractures. Be given hormone replacement therapy (HRT) to treat symptoms of menopause. Follow these instructions at home: Alcohol use Do not drink alcohol if: Your health care provider tells you not to drink. You are pregnant, may be pregnant, or are planning to become pregnant. If you drink alcohol: Limit  how much you have to: 0-1 drink a day. Know how much alcohol is in your drink. In the [place] one drink equals one 12 oz bottle of beer (355 mL), one 5 oz glass of wine (148 mL), or one 1 oz glass of hard liquor (44 mL). Lifestyle Do not use any products that contain nicotine or tobacco. These products include cigarettes, chewing tobacco, and vaping devices, such as e-cigarettes. If you need help quitting, ask your health care provider. Do not use street drugs. Do not share needles. Ask your health care provider for help if you need support or information about quitting drugs. General instructions Schedule regular health, dental, and eye exams. Stay current with your vaccines. Tell your health care provider if: You often feel depressed. You have ever been abused or do not feel safe at home. Summary Adopting a healthy lifestyle and getting preventive care are important in promoting health and wellness. Follow your health care provider's instructions about healthy diet, exercising, and getting tested or screened for diseases. Follow your health care provider's instructions on monitoring your cholesterol and blood pressure. This information is not intended to replace advice given to you by your health care provider. Make sure you discuss any questions you have with your health care provider. Document Revised: 04/30/2021 Document Reviewed: 04/30/2021 Elsevier Patient Education  2024 Else[employer]      Edwina Barth, MD [facility]

## 2023-07-30 ENCOUNTER — Other Ambulatory Visit: Payer: Self-pay | Admitting: *Deleted

## 2023-07-30 ENCOUNTER — Encounter: Payer: Self-pay | Admitting: Emergency Medicine

## 2023-07-30 DIAGNOSIS — Z13820 Encounter for screening for osteoporosis: Secondary | ICD-10-CM

## 2023-07-30 DIAGNOSIS — F17200 Nicotine dependence, unspecified, uncomplicated: Secondary | ICD-10-CM

## 2023-07-30 NOTE — Telephone Encounter (Signed)
Patient needs to be scheduled for her bone density.

## 2023-08-11 ENCOUNTER — Other Ambulatory Visit: Payer: Self-pay

## 2023-08-13 ENCOUNTER — Ambulatory Visit (INDEPENDENT_AMBULATORY_CARE_PROVIDER_SITE_OTHER)
Admission: RE | Admit: 2023-08-13 | Discharge: 2023-08-13 | Disposition: A | Discharge status: Home / Self Care | Payer: Commercial Managed Care - PPO | Source: Ambulatory Visit | Attending: Emergency Medicine | Admitting: Emergency Medicine

## 2023-08-13 DIAGNOSIS — Z13820 Encounter for screening for osteoporosis: Secondary | ICD-10-CM | POA: Diagnosis not present

## 2023-09-12 ENCOUNTER — Encounter: Payer: Self-pay | Admitting: Emergency Medicine

## 2023-10-10 ENCOUNTER — Other Ambulatory Visit: Payer: Self-pay

## 2023-10-10 DIAGNOSIS — F17210 Nicotine dependence, cigarettes, uncomplicated: Secondary | ICD-10-CM

## 2023-10-10 DIAGNOSIS — Z87891 Personal history of nicotine dependence: Secondary | ICD-10-CM

## 2023-10-10 DIAGNOSIS — Z122 Encounter for screening for malignant neoplasm of respiratory organs: Secondary | ICD-10-CM

## 2023-10-13 ENCOUNTER — Ambulatory Visit: Payer: Commercial Managed Care - PPO | Admitting: Physician Assistant

## 2023-10-13 ENCOUNTER — Encounter: Payer: Self-pay | Admitting: Physician Assistant

## 2023-10-13 DIAGNOSIS — F17210 Nicotine dependence, cigarettes, uncomplicated: Secondary | ICD-10-CM

## 2023-10-13 NOTE — Progress Notes (Signed)
Virtual Visit via Telephone Note  I connected with Terence Bon on 10/13/23 at  9:22 AM by telephone and verified that I am speaking with the correct person using two identifiers.  Location: Patient: home Provider: working virtually from home   I discussed the limitations, risks, security and privacy concerns of performing an evaluation and management service by telephone and the availability of in person appointments. I also discussed with the patient that there may be a patient responsible charge related to this service. The patient expressed understanding and agreed to proceed.     Shared Decision Making Visit Lung Cancer Screening Program [phone])   Eligibility: Age 17 y/o Pack Years Smoking History Calculation 28 (# packs/per year x # years smoked) Recent History of coughing up blood  No Unexplained weight loss? No ( >Than 15 pounds within the last 6 months ) Prior History Lung / other cancer No (Diagnosis within the last 5 years already requiring surveillance chest CT Scans). Smoking Status Current Smoker  Visit Components: Discussion included one or more decision making aids. Yes Discussion included risk/benefits of screening. Yes Discussion included potential follow up diagnostic testing for abnormal scans. Yes Discussion included meaning and risk of over diagnosis. Yes Discussion included meaning and risk of False Positives. Yes Discussion included meaning of total radiation exposure. Yes  Counseling Included: Importance of adherence to annual lung cancer LDCT screening. Yes Impact of comorbidities on ability to participate in the program. Yes Ability and willingness to under diagnostic treatment: Yes  Smoking Cessation Counseling: Current Smokers:  Discussed importance of smoking cessation. Yes Information about tobacco cessation classes and interventions provided to patient. Yes Symptomatic Patient. No Diagnosis Code: Tobacco Use Z72.0 Asymptomatic Patient  Yes  Counseling (Intermediate counseling: > three minutes counseling) B1478 Information about tobacco cessation classes and interventions provided to patient. Yes Written Order for Lung Cancer Screening with LDCT placed in Epic. Yes (CT Chest Lung Cancer Screening Low Dose W/O CM) [id number] Z12.2-Screening of respiratory organs Z87.891-Personal history of nicotine dependence   I have spent 25 minutes of face to face/ virtual visit  time with the patient discussing the risks and benefits of lung cancer screening. We discussed the above noted topics. We paused at intervals to allow for questions to be asked and answered to ensure understanding.We discussed that the single most powerful action that anyone can take to decrease their risk of developing lung cancer is to quit smoking.  We discussed options for tools to aid in quitting smoking including nicotine replacement therapy, non-nicotine medications, support groups, Quit Smart classes, and behavior modification. We discussed that often times setting smaller, more achievable goals, such as eliminating 1 cigarette a day for a week and then 2 cigarettes a day for a week can be helpful in slowly decreasing the number of cigarettes smoked. I provided  them  with smoking cessation  information  with contact information for community resources, classes, free nicotine replacement therapy, and access to mobile apps, text messaging, and on-line smoking cessation help. I have also provided  them  the office contact information in the event they have any questions. We discussed the time and location of the scan, and that either Abigail Miyamoto RN, Karlton Lemon, RN  or I will call / send a letter with the results within 24-72 hours of receiving them. The patient verbalized understanding of all of  the above and had no further questions upon leaving the office. They have my contact information in the event they have any  further questions.  I spent 2 minutes counseling  on smoking cessation and the health risks of continued tobacco abuse.  I explained to the patient that there has been a high incidence of coronary artery disease noted on these exams. I explained that this is a non-gated exam therefore degree or severity cannot be determined. This patient is not on statin therapy. I have asked the patient to follow-up with their PCP regarding any incidental finding of coronary artery disease and management with diet or medication as their PCP  feels is clinically indicated. The patient verbalized understanding of the above and had no further questions upon completion of the visit.    Darcella Gasman Rodrick Payson, PA-C

## 2023-10-13 NOTE — Patient Instructions (Signed)

## 2023-10-14 ENCOUNTER — Ambulatory Visit (HOSPITAL_BASED_OUTPATIENT_CLINIC_OR_DEPARTMENT_OTHER)
Admission: RE | Admit: 2023-10-14 | Discharge: 2023-10-14 | Disposition: A | Discharge status: Home / Self Care | Payer: Commercial Managed Care - PPO | Source: Ambulatory Visit | Attending: *Deleted | Admitting: *Deleted

## 2023-10-14 ENCOUNTER — Other Ambulatory Visit: Payer: Self-pay | Admitting: Nurse Practitioner

## 2023-10-14 DIAGNOSIS — Z122 Encounter for screening for malignant neoplasm of respiratory organs: Secondary | ICD-10-CM | POA: Diagnosis present

## 2023-10-14 DIAGNOSIS — F17210 Nicotine dependence, cigarettes, uncomplicated: Secondary | ICD-10-CM | POA: Diagnosis present

## 2023-10-14 DIAGNOSIS — J438 Other emphysema: Secondary | ICD-10-CM | POA: Insufficient documentation

## 2023-10-14 DIAGNOSIS — Z87891 Personal history of nicotine dependence: Secondary | ICD-10-CM

## 2023-10-14 DIAGNOSIS — Z1231 Encounter for screening mammogram for malignant neoplasm of breast: Secondary | ICD-10-CM

## 2023-10-14 DIAGNOSIS — I700 Atherosclerosis of aorta: Secondary | ICD-10-CM | POA: Diagnosis not present

## 2023-10-23 ENCOUNTER — Ambulatory Visit (INDEPENDENT_AMBULATORY_CARE_PROVIDER_SITE_OTHER): Payer: Commercial Managed Care - PPO | Admitting: Radiology

## 2023-10-23 DIAGNOSIS — Z23 Encounter for immunization: Secondary | ICD-10-CM

## 2023-10-23 NOTE — Progress Notes (Signed)
Patient here for second shingle dose vaccine. Patient tolerated well with no complications.

## 2023-11-07 ENCOUNTER — Other Ambulatory Visit: Payer: Self-pay

## 2023-11-07 DIAGNOSIS — Z87891 Personal history of nicotine dependence: Secondary | ICD-10-CM

## 2023-11-07 DIAGNOSIS — Z122 Encounter for screening for malignant neoplasm of respiratory organs: Secondary | ICD-10-CM

## 2023-11-11 ENCOUNTER — Ambulatory Visit
Admission: RE | Admit: 2023-11-11 | Discharge: 2023-11-11 | Disposition: A | Discharge status: Home / Self Care | Payer: Commercial Managed Care - PPO | Source: Ambulatory Visit | Attending: Nurse Practitioner

## 2023-11-11 DIAGNOSIS — Z1231 Encounter for screening mammogram for malignant neoplasm of breast: Secondary | ICD-10-CM

## 2023-11-14 ENCOUNTER — Other Ambulatory Visit: Payer: Self-pay | Admitting: Nurse Practitioner

## 2023-11-14 DIAGNOSIS — R928 Other abnormal and inconclusive findings on diagnostic imaging of breast: Secondary | ICD-10-CM

## 2023-11-16 ENCOUNTER — Other Ambulatory Visit: Payer: Self-pay | Admitting: Medical Genetics

## 2023-11-16 DIAGNOSIS — Z006 Encounter for examination for normal comparison and control in clinical research program: Secondary | ICD-10-CM

## 2023-11-29 ENCOUNTER — Ambulatory Visit: Payer: Commercial Managed Care - PPO

## 2023-11-29 ENCOUNTER — Ambulatory Visit
Admission: RE | Admit: 2023-11-29 | Discharge: 2023-11-29 | Disposition: A | Discharge status: Home / Self Care | Payer: Commercial Managed Care - PPO | Source: Ambulatory Visit | Attending: Nurse Practitioner | Admitting: Nurse Practitioner

## 2023-11-29 DIAGNOSIS — R928 Other abnormal and inconclusive findings on diagnostic imaging of breast: Secondary | ICD-10-CM

## 2023-12-16 ENCOUNTER — Other Ambulatory Visit (HOSPITAL_COMMUNITY): Payer: Commercial Managed Care - PPO

## 2023-12-22 ENCOUNTER — Other Ambulatory Visit: Payer: Self-pay | Admitting: Emergency Medicine

## 2023-12-22 DIAGNOSIS — F17200 Nicotine dependence, unspecified, uncomplicated: Secondary | ICD-10-CM

## 2024-01-12 ENCOUNTER — Other Ambulatory Visit (HOSPITAL_COMMUNITY)
Admission: RE | Admit: 2024-01-12 | Discharge: 2024-01-12 | Disposition: A | Discharge status: Home / Self Care | Payer: Self-pay | Source: Ambulatory Visit | Attending: Oncology | Admitting: Oncology

## 2024-01-12 DIAGNOSIS — Z006 Encounter for examination for normal comparison and control in clinical research program: Secondary | ICD-10-CM | POA: Insufficient documentation

## 2024-01-22 LAB — GENECONNECT MOLECULAR SCREEN: Genetic Analysis Overall Interpretation: NEGATIVE

## 2024-07-27 ENCOUNTER — Other Ambulatory Visit: Payer: Self-pay | Admitting: Emergency Medicine

## 2024-07-27 DIAGNOSIS — K219 Gastro-esophageal reflux disease without esophagitis: Secondary | ICD-10-CM

## 2024-10-14 ENCOUNTER — Encounter: Payer: Self-pay | Admitting: Acute Care

## 2024-11-29 ENCOUNTER — Telehealth: Admitting: Student in an Organized Health Care Education/Training Program

## 2024-11-29 ENCOUNTER — Encounter: Payer: Self-pay | Admitting: Student in an Organized Health Care Education/Training Program

## 2024-11-29 ENCOUNTER — Ambulatory Visit: Payer: Self-pay

## 2024-11-29 DIAGNOSIS — J209 Acute bronchitis, unspecified: Secondary | ICD-10-CM | POA: Insufficient documentation

## 2024-11-29 DIAGNOSIS — F17200 Nicotine dependence, unspecified, uncomplicated: Secondary | ICD-10-CM

## 2024-11-29 MED ORDER — VARENICLINE TARTRATE 1 MG PO TABS: ORAL_TABLET | ORAL | 0 refills | Status: AC

## 2024-11-29 MED ORDER — AZITHROMYCIN 250 MG PO TABS: ORAL_TABLET | ORAL | 0 refills | Status: AC

## 2024-11-29 MED ORDER — PREDNISONE 20 MG PO TABS: 40.0000 mg | ORAL_TABLET | Freq: Every day | ORAL | 0 refills | Status: AC

## 2024-11-29 MED ORDER — ALBUTEROL SULFATE HFA 108 (90 BASE) MCG/ACT IN AERS: 2.0000 | INHALATION_SPRAY | Freq: Four times a day (QID) | RESPIRATORY_TRACT | 3 refills | Status: AC | PRN

## 2024-11-29 NOTE — Patient Instructions (Signed)
  VISIT SUMMARY: During today's visit, we discussed your recent symptoms of a productive cough, shortness of breath, and throat burning. We also reviewed your ongoing recovery from rotator cuff surgery and your efforts to reduce smoking.  YOUR PLAN: -EMPHYSEMA WITH ACUTE BRONCHITIS: Emphysema is a type of chronic obstructive pulmonary disease (COPD) that damages the air sacs in your lungs, making it hard to breathe. Acute bronchitis is an inflammation of the bronchial tubes, often causing a productive cough. We will treat this with a Z-Pak (azithromycin ) and prednisone  40 mg for 5 days. Your albuterol  inhaler will be refilled. Please seek medical attention if your shortness of breath worsens or if you encounter any difficulties at home.  -NICOTINE DEPENDENCE: Nicotine dependence is an addiction to tobacco products. You have reduced smoking but have not quit entirely. We will prescribe Chantix  to help you stop smoking, and it would be beneficial if your husband also quits smoking to support your efforts.  -AFTERCARE FOLLOWING ROTATOR CUFF REPAIR: You are recovering well from your rotator cuff surgery and attending physical therapy once a week. Coughing is causing some shoulder pain. Continue with your physical therapy as scheduled, and we will focus on treating your lung issues to ensure you can keep up with your recovery.  INSTRUCTIONS: Please follow the prescribed medication regimen: take the Z-Pak and prednisone  as directed, and use your albuterol  inhaler as needed. If your shortness of breath worsens or you have any difficulties, seek medical attention immediately. Continue with your physical therapy sessions and consider discussing smoking cessation with your husband to improve your chances of quitting.

## 2024-11-29 NOTE — Assessment & Plan Note (Signed)
 She has ongoing nicotine dependence, having reduced smoking but not quit. Her husband's smoking complicates cessation efforts. She is willing to retry Chantix . Prescribed Chantix  for smoking cessation and encourage her husband to quit smoking to increase her chances of success.

## 2024-11-29 NOTE — Assessment & Plan Note (Signed)
 She experiences an acute exacerbation with dry cough, throat burning, and shortness of breath. There is no fever or sinus pain, but she has a headache.  She has a history of emphysema on prior imaging related to tobacco use.  Prescribed azithromycin  (Z-Pak) and prednisone  40 mg for 5 days. Refilled albuterol  inhaler. Advise seeking medical attention if shortness of breath worsens or if there are difficulties at home.

## 2024-11-29 NOTE — Progress Notes (Signed)
 MyChart Video Visit    Virtual Visit via Video Note   This format is felt to be most appropriate for this patient at this time. Physical exam was limited by quality of the video and audio technology used for the visit.    Patient location: home Provider location: [facility] Persons involved in the visit: patient, provider  I discussed the limitations of evaluation and management by telemedicine and the availability of in person appointments. The patient expressed understanding and agreed to proceed.  Patient: Victoria Mann   DOB: 1961-06-24   63 y.o. Female  MRN: [id number] Visit Date: 11/29/2024  Today's healthcare provider: Cleatus Debby Specking, MD   Chief Complaint  Patient presents with   Cough    Triage notes in chart     Subjective:    HPI  Discussed the use of AI scribe software for clinical note transcription with the patient, who gave verbal consent to proceed.  History of Present Illness Victoria Mann is a 63 year old female with early COPD who presents with a productive cough and shortness of breath.  For about a week, she has experienced a dry cough that became productive over the weekend. She also reports a burning sensation in her throat. She typically develops bronchitis annually and has experienced shortness of breath with activity since yesterday. She used an expired albuterol  inhaler this morning.  A chest CT scan from a year ago reportedly showed early COPD or possibly emphysema, according to the patient's recollection. She has a history of using a Z-Pak, prednisone , and an albuterol  inhaler for bronchitis episodes. She confirms using albuterol  as her inhaler medication.  She reports head congestion and a headache over the last two days, describing it as a 'hangover kind of headache'. She denies fever and recent exposure to sick individuals, noting that only she and her husband are at home.  She is staying hydrated by drinking a lot of water.  She underwent rotator cuff repair surgery on November 7th and is recovering well, better than expected. She is attending physical therapy once a week. Coughing exacerbates her shoulder pain.  She has not quit smoking but has cut back. Her husband is a heavy smoker, which makes quitting challenging. She has tried Chantix  in the past but stopped taking it last year. She is open to trying it again. She usually takes a five-day course of prednisone  and has not experienced issues with blood sugar or diabetes.  She is not sleeping well and is currently sleeping in a chair.     Objective:     Physical Exam   Gen: Well-appearing woman Lungs: Unlabored breathing, frequent coughing MSK: Right arm is in a sling Neuro: Alert, conversational Psych: Appropriate mood and affect    Assessment & Plan:    Problem List Items Addressed This Visit       Unprioritized   Current smoker   She has ongoing nicotine dependence, having reduced smoking but not quit. Her husband's smoking complicates cessation efforts. She is willing to retry Chantix . Prescribed Chantix  for smoking cessation and encourage her husband to quit smoking to increase her chances of success.      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 There is no fever or sinus pain, but she has a headache.  She has a history of emphysema on prior imaging related to tobacco use.  Prescribed azithromycin  (Z-Pak) and prednisone  40 mg for 5 days. Refilled albuterol  inhaler. Advise seeking medical attention if shortness of breath worsens or if there are difficulties at home.      Relevant Medications   albuterol  (VENTOLIN  HFA) 108 (90 Base) MCG/ACT inhaler   predniSONE  (DELTASONE ) 20 MG tablet    azithromycin  (ZITHROMAX ) 250 MG tablet   varenicline  (CHANTIX  CONTINUING MONTH PAK) 1 MG tablet    Meds ordered this encounter  Medications   albuterol  (VENTOLIN  HFA) 108 (90 Base) MCG/ACT inhaler    Sig: Inhale 2 puffs into the lungs every 6 (six) hours as needed for wheezing or shortness of breath.    Dispense:  1 each    Refill:  3   predniSONE  (DELTASONE ) 20 MG tablet    Sig: Take 2 tablets (40 mg total) by mouth daily with breakfast for 5 days.    Dispense:  10 tablet    Refill:  0   azithromycin  (ZITHROMAX ) 250 MG tablet    Sig: Take 2 tablets on day 1, then 1 tablet daily on days 2 through 5    Dispense:  6 tablet    Refill:  0   varenicline  (CHANTIX  CONTINUING MONTH PAK) 1 MG tablet    Sig: Take 0.5 tablets (0.5 mg total) by mouth daily for 3 days, THEN 0.5 tablets (0.5 mg total) 2 (two) times daily for 3 days, THEN 1 tablet (1 mg total) 2 (two) times daily for 22 days.    Dispense:  49 tablet    Refill:  0     No follow-ups on file.     I discussed the assessment and treatment plan with the patient. The patient was provided an opportunity to ask questions and all were answered. The patient agreed with the plan and demonstrated an understanding of the instructions.   The patient was advised to call back or seek an in-person evaluation if the symptoms worsen or if the condition fails to improve as anticipated.  I provided 14 minutes of non-face-to-face time during this encounter.  Cleatus Debby Specking, MD [facility]

## 2024-11-29 NOTE — Telephone Encounter (Signed)
 FYI Only or Action Required?: FYI only for provider: appointment scheduled on today, video as requested, not driving due to recent shoulder surgery.  Patient was last seen in primary care on 07/21/2023 by Purcell Emil Schanz, MD.  Called Nurse Triage reporting Cough.  Symptoms began a week ago.  Interventions attempted: Rest, hydration, or home remedies.  Symptoms are: unchanged.  Triage Disposition: See HCP Within 4 Hours (Or PCP Triage)  Patient/caregiver understands and will follow disposition?: Yes    Copied from CRM [id number]. Topic: Clinical - Red Word Triage >> Nov 29, 2024 10:56 AM Ashley R wrote: Red Word that prompted transfer to Nurse Triage: Worsening cough, shortness of breath, suspected bronchitis, calling to schedule a virtual appt visit with PCP, denied scheduling.   ----------------------------------------------------------------------- From previous Reason for Contact - Scheduling: Patient/patient representative is calling to schedule an appointment. Refer to attachments for appointment information. Reason for Disposition  [1] MILD difficulty breathing (e.g., minimal/no SOB at rest, SOB with walking, pulse < 100) AND [2] Berggren present when not coughing  Answer Assessment - Initial Assessment Questions Additional info: Requesting video visit due to recent shoulder surgery and unable to drive. Scheduled video visit today at alternate regional clinic.   1. ONSET: When did the cough begin?      1.5 weeks 2. SEVERITY: How bad is the cough today?      strong 3. SPUTUM: Describe the color of your sputum (e.g., none, dry cough; clear, white, yellow, green)     2 days of light yellow  4. HEMOPTYSIS: Are you coughing up any blood? If Yes, ask: How much? (e.g., flecks, streaks, tablespoons, etc.)     Denies  5. DIFFICULTY BREATHING: Are you having difficulty breathing? If Yes, ask: How bad is it? (e.g., mild, moderate, severe)      Shortness of breath  with ambulation 6. FEVER: Do you have a fever? If Yes, ask: What is your temperature, how was it measured, and when did it start?     Denies  7. CARDIAC HISTORY: Do you have any history of heart disease? (e.g., heart attack, congestive heart failure)       8. LUNG HISTORY: Do you have any history of lung disease?  (e.g., pulmonary embolus, asthma, emphysema)     History of bronchitis  9. PE RISK FACTORS: Do you have a history of blood clots? (or: recent major surgery, recent prolonged travel, bedridden)      10. OTHER SYMPTOMS: Do you have any other symptoms? (e.g., runny nose, wheezing, chest pain)       Suspects bronchitis  11. PREGNANCY: Is there any chance you are pregnant? When was your last menstrual period?        12. TRAVEL: Have you traveled out of the country in the last month? (e.g., travel history, exposures)  Protocols used: Cough - Acute Productive-A-AH
